# Patient Record
Sex: Female | Born: 1980 | Race: Black or African American | Hispanic: No | Marital: Single | Smoking: Current every day smoker
Health system: Southern US, Community
[De-identification: ages and names within clinical notes are randomized; demographics above are authoritative.]

## PROBLEM LIST (undated history)

## (undated) DIAGNOSIS — F419 Anxiety disorder, unspecified: Secondary | ICD-10-CM

## (undated) DIAGNOSIS — T7840XA Allergy, unspecified, initial encounter: Secondary | ICD-10-CM

## (undated) DIAGNOSIS — F32A Depression, unspecified: Secondary | ICD-10-CM

## (undated) DIAGNOSIS — F4310 Post-traumatic stress disorder, unspecified: Secondary | ICD-10-CM

## (undated) DIAGNOSIS — F329 Major depressive disorder, single episode, unspecified: Secondary | ICD-10-CM

## (undated) DIAGNOSIS — J40 Bronchitis, not specified as acute or chronic: Secondary | ICD-10-CM

## (undated) DIAGNOSIS — K589 Irritable bowel syndrome without diarrhea: Secondary | ICD-10-CM

## (undated) HISTORY — PX: APPENDECTOMY: SHX54

## (undated) HISTORY — DX: Anxiety disorder, unspecified: F41.9

## (undated) HISTORY — DX: Irritable bowel syndrome, unspecified: K58.9

## (undated) HISTORY — DX: Allergy, unspecified, initial encounter: T78.40XA

## (undated) HISTORY — PX: BREAST BIOPSY: SHX20

## (undated) HISTORY — DX: Bronchitis, not specified as acute or chronic: J40

---

## 2006-11-07 ENCOUNTER — Emergency Department: Payer: Self-pay | Admitting: Emergency Medicine

## 2007-01-28 ENCOUNTER — Emergency Department: Payer: Self-pay | Admitting: Emergency Medicine

## 2007-01-29 ENCOUNTER — Other Ambulatory Visit: Payer: Self-pay

## 2007-01-29 ENCOUNTER — Emergency Department: Payer: Self-pay | Admitting: Emergency Medicine

## 2007-10-08 ENCOUNTER — Emergency Department: Payer: Self-pay | Admitting: Unknown Physician Specialty

## 2008-05-08 IMAGING — US US PELV - US TRANSVAGINAL
1 series · 17 of 25 positions shown · non-contrast
Comparison: none

REASON FOR EXAM: Post D&C abortion,       pain bleeding
COMMENTS:

[Series 1: us pelv - us transvaginal · 17 of 39 slices shown]
[im 1/39]
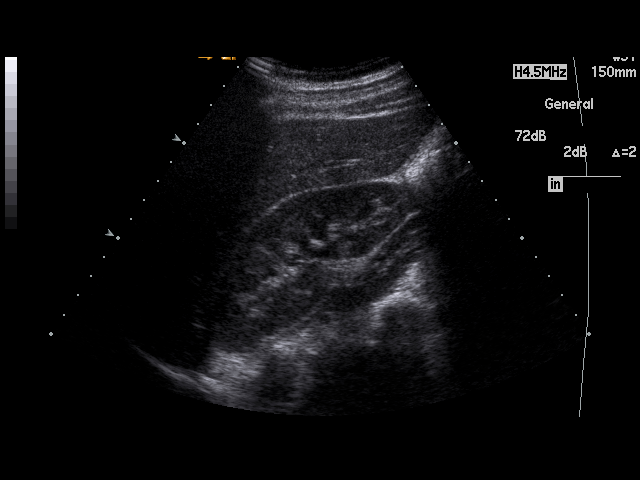
[im 4/39]
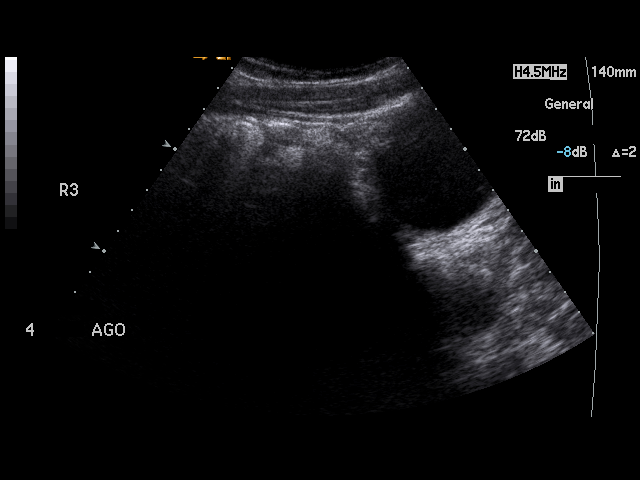
[im 5/39]
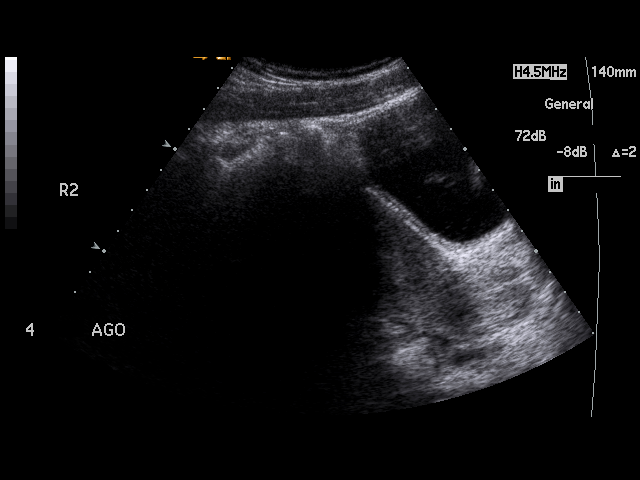
[im 8/39]
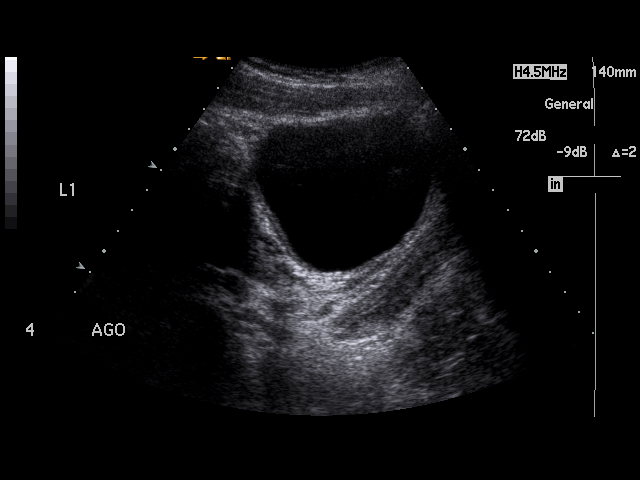
[im 10/39]
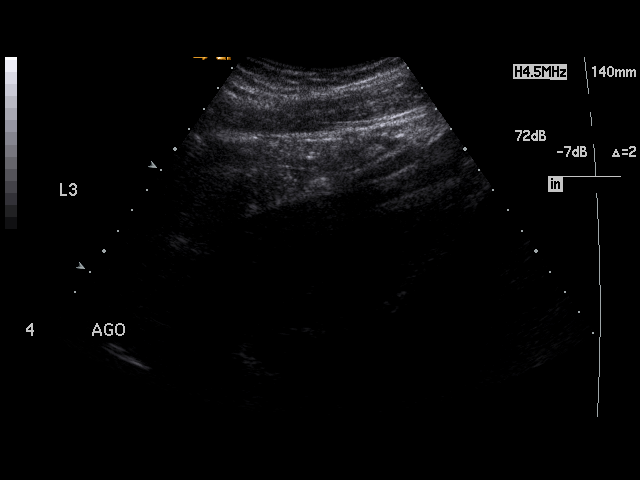
[im 13/39]
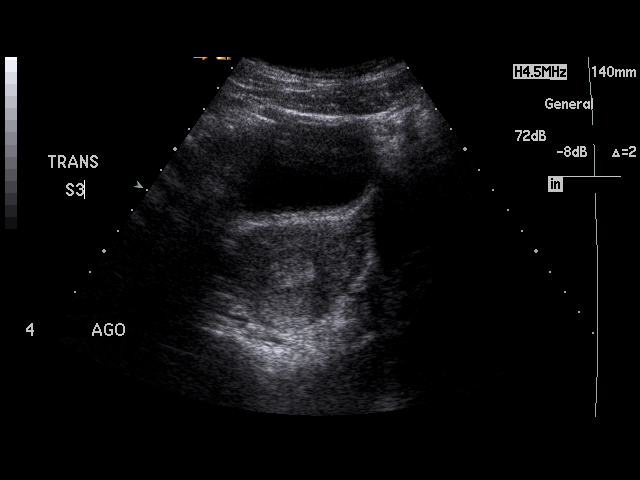
[im 15/39]
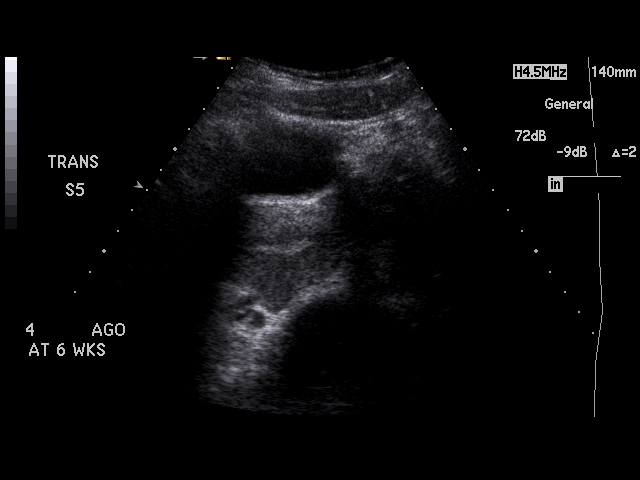
[im 18/39]
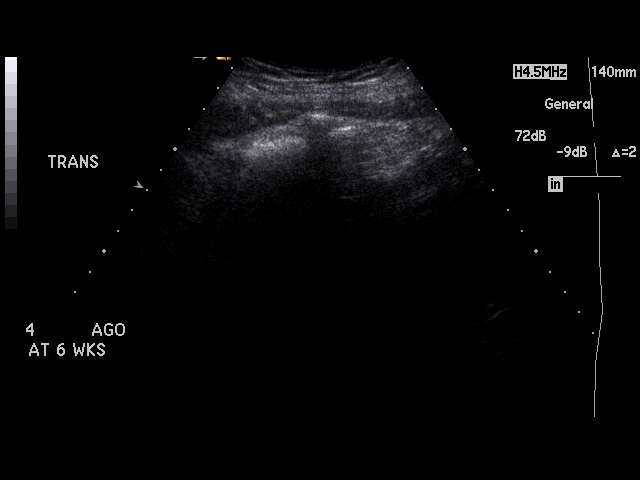
[im 20/39]
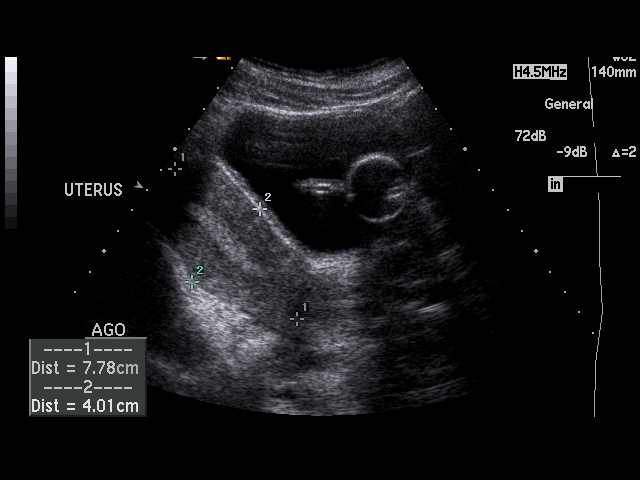
[im 21/39]
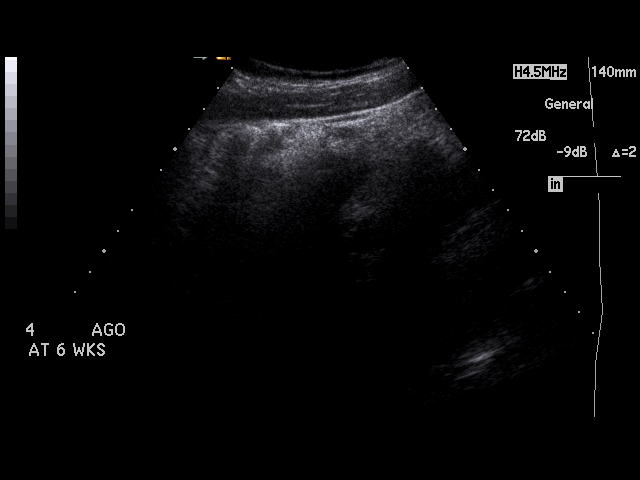
[im 24/39]
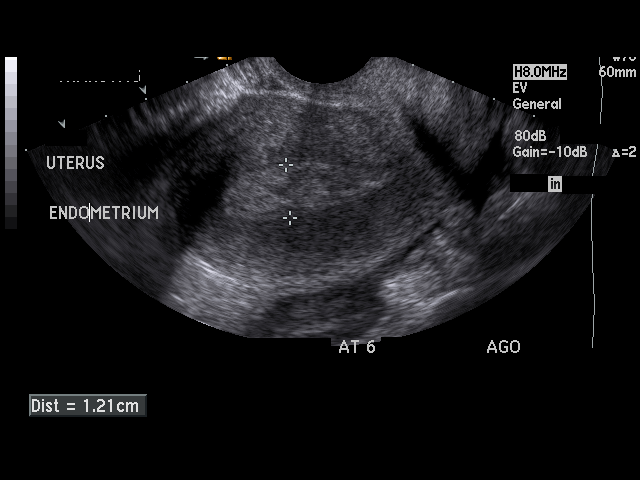
[im 26/39]
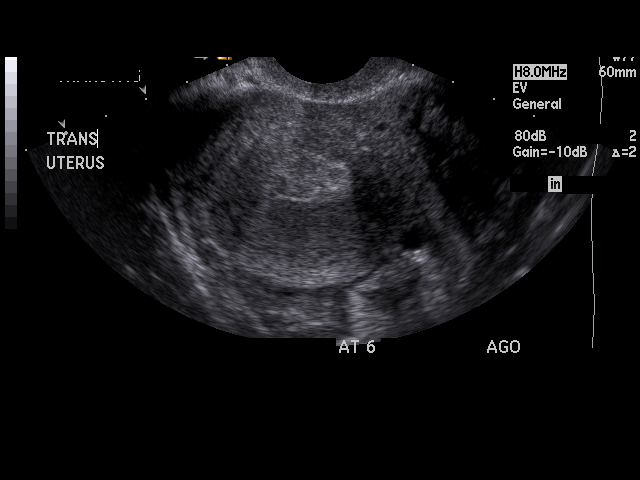
[im 29/39]
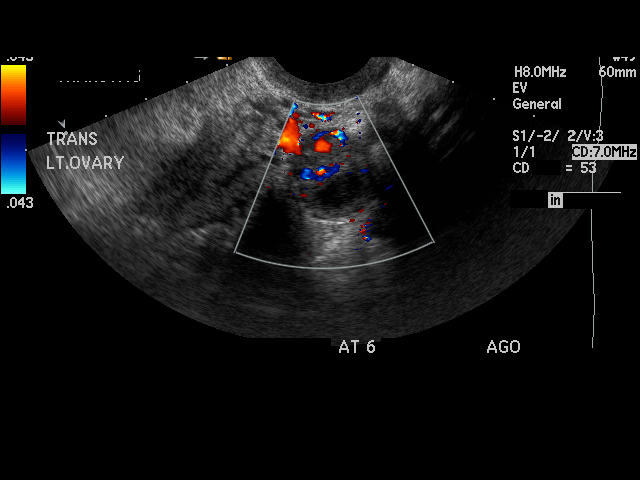
[im 31/39]
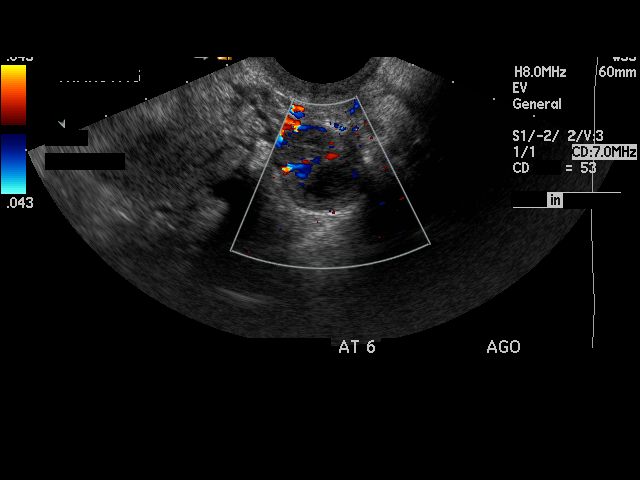
[im 34/39]
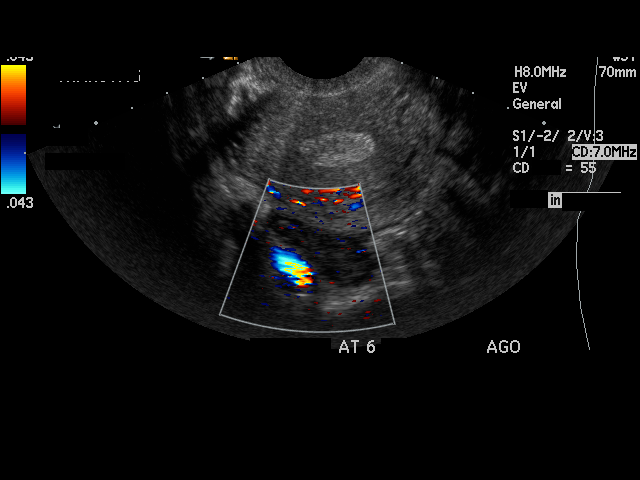
[im 35/39]
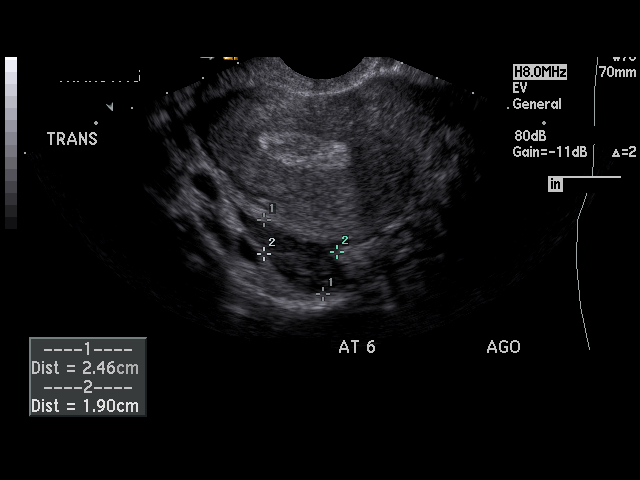
[im 39/39]
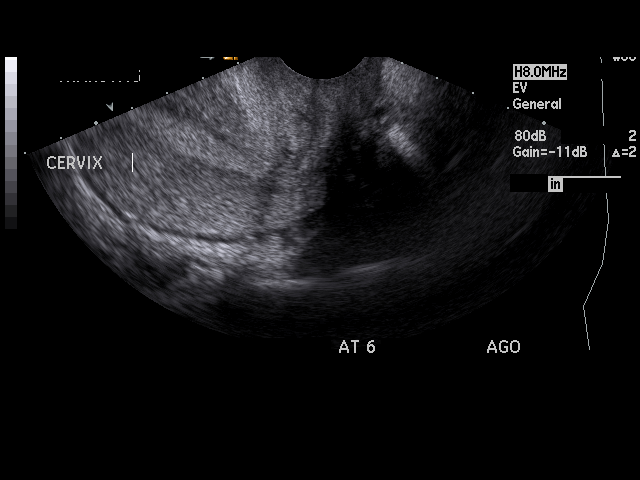

[17 of 25 positions shown; findings below may reference images not displayed]

PROCEDURE:     US  - US PELVIS MASS EXAM  - [DATE] [DATE] [DATE]  [DATE]

RESULT:     Transabdominal and endovaginal images were obtained of the
pelvis.  The uterus measures 7.78 x 4.39 x 5.8 cm.  Endometrial thickness is
1.21 cm.  There is increased echogenicity within the endometrial canal.
RIGHT and LEFT ovaries are unremarkable.  There is no evidence of free fluid
or drainable loculated fluid collections.  Limited evaluation of the kidneys
is unremarkable.
IMPRESSION: 1.     No ultrasound evidence of an intrauterine gestational sac. There is
increased echogenicity within the endometrial canal possibly representing an
element of heme. Clinical correlation is recommended.
2.     A preliminary faxed report was relayed to Dr. Moolman of the emergency
department on 11/07/06.

## 2009-09-12 ENCOUNTER — Emergency Department: Payer: Self-pay | Admitting: Emergency Medicine

## 2011-04-13 ENCOUNTER — Emergency Department: Payer: Self-pay | Admitting: Emergency Medicine

## 2011-07-03 ENCOUNTER — Emergency Department: Payer: Self-pay | Admitting: Emergency Medicine

## 2013-04-03 ENCOUNTER — Emergency Department: Payer: Self-pay | Admitting: Internal Medicine

## 2016-10-05 ENCOUNTER — Emergency Department
Admission: EM | Admit: 2016-10-05 | Discharge: 2016-10-05 | Discharge status: Against Medical Advice | Payer: Non-veteran care | Attending: Emergency Medicine | Admitting: Emergency Medicine

## 2016-10-05 ENCOUNTER — Encounter: Payer: Self-pay | Admitting: *Deleted

## 2016-10-05 ENCOUNTER — Emergency Department: Payer: Non-veteran care

## 2016-10-05 DIAGNOSIS — R102 Pelvic and perineal pain: Secondary | ICD-10-CM | POA: Diagnosis present

## 2016-10-05 DIAGNOSIS — X58XXXA Exposure to other specified factors, initial encounter: Secondary | ICD-10-CM

## 2016-10-05 DIAGNOSIS — F17210 Nicotine dependence, cigarettes, uncomplicated: Secondary | ICD-10-CM | POA: Diagnosis not present

## 2016-10-05 HISTORY — DX: Major depressive disorder, single episode, unspecified: F32.9

## 2016-10-05 HISTORY — DX: Post-traumatic stress disorder, unspecified: F43.10

## 2016-10-05 HISTORY — DX: Depression, unspecified: F32.A

## 2016-10-05 LAB — CBC WITH DIFFERENTIAL/PLATELET
BASOS ABS: 0 10*3/uL (ref 0–0.1)
Basophils Relative: 1 %
EOS PCT: 1 %
Eosinophils Absolute: 0 10*3/uL (ref 0–0.7)
HEMATOCRIT: 34.5 % — AB (ref 35.0–47.0)
Hemoglobin: 12 g/dL (ref 12.0–16.0)
LYMPHS ABS: 1.5 10*3/uL (ref 1.0–3.6)
LYMPHS PCT: 30 %
MCH: 32.9 pg (ref 26.0–34.0)
MCHC: 34.9 g/dL (ref 32.0–36.0)
MCV: 94.4 fL (ref 80.0–100.0)
MONO ABS: 0.4 10*3/uL (ref 0.2–0.9)
MONOS PCT: 9 %
NEUTROS ABS: 3 10*3/uL (ref 1.4–6.5)
Neutrophils Relative %: 59 %
PLATELETS: 277 10*3/uL (ref 150–440)
RBC: 3.65 MIL/uL — ABNORMAL LOW (ref 3.80–5.20)
RDW: 12.5 % (ref 11.5–14.5)
WBC: 5 10*3/uL (ref 3.6–11.0)

## 2016-10-05 LAB — COMPREHENSIVE METABOLIC PANEL
ALBUMIN: 4.1 g/dL (ref 3.5–5.0)
ALT: 14 U/L (ref 14–54)
ANION GAP: 7 (ref 5–15)
AST: 17 U/L (ref 15–41)
Alkaline Phosphatase: 50 U/L (ref 38–126)
BUN: 9 mg/dL (ref 6–20)
CHLORIDE: 104 mmol/L (ref 101–111)
CO2: 26 mmol/L (ref 22–32)
Calcium: 8.7 mg/dL — ABNORMAL LOW (ref 8.9–10.3)
Creatinine, Ser: 0.73 mg/dL (ref 0.44–1.00)
GFR calc Af Amer: >60 mL/min (ref >60)
GFR calc non Af Amer: >60 mL/min (ref >60)
Glucose, Bld: 105 mg/dL — ABNORMAL HIGH (ref 65–99)
POTASSIUM: 3.5 mmol/L (ref 3.5–5.1)
SODIUM: 137 mmol/L (ref 135–145)
TOTAL PROTEIN: 6.9 g/dL (ref 6.5–8.1)
Total Bilirubin: 0.6 mg/dL (ref 0.3–1.2)

## 2016-10-05 LAB — URINALYSIS, COMPLETE (UACMP) WITH MICROSCOPIC
BACTERIA UA: NONE SEEN
BILIRUBIN URINE: NEGATIVE
Glucose, UA: NEGATIVE mg/dL
Ketones, ur: NEGATIVE mg/dL
Nitrite: NEGATIVE
Protein, ur: NEGATIVE mg/dL
SPECIFIC GRAVITY, URINE: 1.016 (ref 1.005–1.030)
pH: 6 (ref 5.0–8.0)

## 2016-10-05 LAB — POCT PREGNANCY, URINE: Preg Test, Ur: NEGATIVE

## 2016-10-05 LAB — PROTIME-INR
INR: 1.11
Prothrombin Time: 14.4 seconds (ref 11.4–15.2)

## 2016-10-05 MED ORDER — SODIUM CHLORIDE 0.9 % IV BOLUS (SEPSIS)
1000.0000 mL | Freq: Once | INTRAVENOUS | Status: AC
  Administered 2016-10-05: 1000 mL via INTRAVENOUS

## 2016-10-05 MED ORDER — GABAPENTIN 300 MG PO CAPS
900.0000 mg | ORAL_CAPSULE | Freq: Once | ORAL | Status: AC
  Administered 2016-10-05: 900 mg via ORAL
  Filled 2016-10-05: qty 3

## 2016-10-05 MED ORDER — HALOPERIDOL LACTATE 5 MG/ML IJ SOLN
2.5000 mg | Freq: Once | INTRAMUSCULAR | Status: AC
  Administered 2016-10-05: 2.5 mg via INTRAVENOUS
  Filled 2016-10-05: qty 1

## 2016-10-05 NOTE — ED Notes (Signed)
Pelvic exam completed by Dr. Lamont Snowballifenbark. Assisted by this RN. Patient tolerated well.

## 2016-10-05 NOTE — ED Provider Notes (Signed)
[facility] Emergency Department Provider Note  ____________________________________________   First MD Initiated Contact with Patient 10/05/16 1604     (approximate)  I have reviewed the triage vital signs and the nursing notes.   HISTORY  Chief Complaint Pelvic Pain    HPI Melissa Bond is a 36 y.o. female who comes to the emergency department with severe aching vaginal and lower pelvic pain. She is G3P1Tab2. She reports intermittent pain for the past 2 years and has been followed extensively at the [facility]. She said today her pain was worse while at work and she could not drive herself all the way to the [place] which is what prompted the visit to our emergency department. She says she's been seen multiple times at the [place] including a visit to see an OB gynecologist but has been never given a clear diagnosis. She says she's had a pelvic ultrasound and also a pelvic MRI and was told "I had an infection in my urethra". The pain happens 3-4 days a week past several years she takes ibuprofen throughout the day to help with the pain. The pain often wakes her from her sleep. Her last menstrual period was 2 weeks ago and she does not note any temporal relation of the pain and her period. She does report this.. Denies dysuria frequency or hesitancy. Denies fevers or chills. Denies back pain. She is currently taking an unknown   Past Medical History:  Diagnosis Date  . Depression   . PTSD (post-traumatic stress disorder)     There are no active problems to display for this patient.   History reviewed. No pertinent surgical history.  Prior to Admission medications   Not on File    Allergies Doxapap-n [propoxyphene] and Doxycycline  No family history on file.  Social History Social History  Substance Use Topics  . Smoking status: Current Every Day Smoker    Packs/day: 1.00    Types: Cigarettes  . Smokeless tobacco: Not on file  . Alcohol  use Yes    Review of Systems Constitutional: No fever/chills Eyes: No visual changes. ENT: No sore throat. Cardiovascular: Denies chest pain. Respiratory: Denies shortness of breath. Gastrointestinal: No abdominal pain.  No nausea, no vomiting.  No diarrhea.  No constipation. Genitourinary: Positive for pelvic pain Musculoskeletal: Negative for back pain. Skin: Negative for rash. Neurological: Negative for headaches, focal weakness or numbness.  10-point ROS otherwise negative.  ____________________________________________   PHYSICAL EXAM:  VITAL SIGNS: ED Triage Vitals  Enc Vitals Group     BP 10/05/16 1537 (!) 144/84     Pulse Rate 10/05/16 1537 (!) 12     Resp 10/05/16 1537 20     Temp 10/05/16 1537 98.8 F (37.1 C)     Temp Source 10/05/16 1537 Oral     SpO2 10/05/16 1537 99 %     Weight 10/05/16 1538 204 lb (92.5 kg)     Height 10/05/16 1538 5\' 5"  (1.651 m)     Head Circumference --      Peak Flow --      Pain Score 10/05/16 1538 6     Pain Loc --      Pain Edu? --      Excl. in GC? --     Constitutional: Alert and oriented. Well appearing and in no acute distress. Eyes: Conjunctivae are normal. PERRL. EOMI. Head: Atraumatic. Nose: No congestion/rhinnorhea. Mouth/Throat: Mucous membranes are moist.  Oropharynx non-erythematous. Neck: No stridor.   Cardiovascular:  Normal rate, regular rhythm. Grossly normal heart sounds.  Good peripheral circulation. Respiratory: Normal respiratory effort.  No retractions. Lungs CTAB. Gastrointestinal: Soft and nontender. No distention. No abdominal bruits. No CVA tenderness.  Pelvic exam chaperoned by female nurse,: Normal external exam os closed no dischagre, no lacerations, no CMT, no adnexal tenderness, no Bartholin's swelling Musculoskeletal: No lower extremity tenderness nor edema.  No joint effusions. Neurologic:  Normal speech and language. No gross focal neurologic deficits are appreciated. No gait instability. Skin:   Skin is warm, dry and intact. No rash noted. Psychiatric: Mood and affect are normal. Speech and behavior are normal.  ____________________________________________   LABS (all labs ordered are listed, but only abnormal results are displayed)  Labs Reviewed  URINALYSIS, COMPLETE (UACMP) WITH MICROSCOPIC - Abnormal; Notable for the following:       Result Value   Color, Urine YELLOW (*)    APPearance CLEAR (*)    Hgb urine dipstick SMALL (*)    Leukocytes, UA TRACE (*)    Squamous Epithelial / LPF 0-5 (*)    All other components within normal limits  COMPREHENSIVE METABOLIC PANEL - Abnormal; Notable for the following:    Glucose, Bld 105 (*)    Calcium 8.7 (*)    All other components within normal limits  CBC WITH DIFFERENTIAL/PLATELET - Abnormal; Notable for the following:    RBC 3.65 (*)    HCT 34.5 (*)    All other components within normal limits  PROTIME-INR  POC URINE PREG, ED  POCT PREGNANCY, URINE   ____________________________________________  EKG   ____________________________________________  RADIOLOGY  Pelvic ultrasound with no acute disease ____________________________________________   PROCEDURES  Procedure(s) performed: no  Procedures  Critical Care performed: no  ____________________________________________   INITIAL IMPRESSION / ASSESSMENT AND PLAN / ED COURSE  Pertinent labs & imaging results that were available during my care of the patient were reviewed by me and considered in my medical decision making (see chart for details).     ----------------------------------------- 6:38 PM on 10/05/2016 -----------------------------------------  I was on my way into discharge the patient when I was notified by nursing staff that the patient had eloped. I would have discharged her anyway and she leaves in good stable condition with no emergent conditions notified. IV was removed prior to  leaving.  ____________________________________________   FINAL CLINICAL IMPRESSION(S) / ED DIAGNOSES  Final diagnoses:  Unknown cause of injury  Pelvic pain in female      NEW MEDICATIONS STARTED DURING THIS VISIT:  There are no discharge medications for this patient.    Note:  This document was prepared using Dragon voice recognition software and may include unintentional dictation errors.     Merrily BrittleNeil Sander Remedios, MD 10/08/16 2029

## 2016-10-05 NOTE — ED Triage Notes (Signed)
Pt saw PCP yesterday for chronic pelvic pain, appointment with GYN Monday, pt complains of lower pelvic pain, pt denies vaginal bleeding

## 2016-10-05 NOTE — ED Notes (Signed)
This RN received a call from [place], First nurse notifying me that the patient is attempting to walk out of the main lobby doors with IV in place. Kennedy Buckerhanh, triage tech removed IV. Patient did not receive discharge instructions or results from her US. Unable to obtain AMA signature and discharge vital signs.

## 2016-10-05 NOTE — ED Notes (Signed)
Patient transported to US 

## 2016-10-05 NOTE — Discharge Instructions (Signed)
[facility] as scheduled return to the emergency department sooner for any new or worsening symptoms.

## 2017-02-20 ENCOUNTER — Ambulatory Visit: Payer: Non-veteran care | Admitting: Physical Therapy

## 2017-02-28 ENCOUNTER — Ambulatory Visit: Payer: Non-veteran care | Attending: Obstetrics and Gynecology | Admitting: Physical Therapy

## 2017-02-28 ENCOUNTER — Encounter: Payer: Self-pay | Admitting: Physical Therapy

## 2017-02-28 DIAGNOSIS — R278 Other lack of coordination: Secondary | ICD-10-CM | POA: Diagnosis present

## 2017-02-28 DIAGNOSIS — M791 Myalgia, unspecified site: Secondary | ICD-10-CM

## 2017-02-28 DIAGNOSIS — R29898 Other symptoms and signs involving the musculoskeletal system: Secondary | ICD-10-CM | POA: Diagnosis present

## 2017-02-28 NOTE — Therapy (Signed)
[facility] MAIN [facility] SERVICES [address] [place], [place], [address] Phone: [phone]   Fax:  [phone]  Physical Therapy Evaluation  Patient Details  Name: Melissa Bond MRN: [id number] Date of Birth: 05-16-1981 Referring Provider: Lezlie Lye   Encounter Date: 02/28/2017      PT End of Session - 02/28/17 1441    Visit Number 1   Number of Visits 12   Date for PT Re-Evaluation 06/13/2017   Authorization Type g codes    PT Start Time 1338   PT Stop Time 1440   PT Time Calculation (min) 62 min   Activity Tolerance Patient tolerated treatment well;No increased pain   Behavior During Therapy WFL for tasks assessed/performed      Past Medical History:  Diagnosis Date  . Allergy   . Anxiety   . Bronchitis   . Depression   . IBS (irritable bowel syndrome)   . PTSD (post-traumatic stress disorder)     Past Surgical History:  Procedure Laterality Date  . APPENDECTOMY    . BREAST BIOPSY Left    benign  . CESAREAN SECTION      There were no vitals filed for this visit.       Subjective Assessment - 02/28/17 1349    Subjective 1) Pt reports having pelvic pain located on the vaginal walls, cervix area, and radiating pain to anus.  This pain starting in 2003 and it has been progressively worse. Sometimes it is a dull ache 4/10 which makes it uncomfortable to sit and wakes her up during sleep. Other times, pain worsens 10/10 as intense throbbing pain. Pain is described as if something about to fall out.  In Feb 2018, pt visited ER for this pain. Pt would have to take Naproxen 4-5 x /day to manage the pain and her MD has advised her to not that many.  Pain interferes with her relationship with her boyfriend and children, sitting, standing for > 10 min, getting in and out of car,   and laying in certain positions on her side.  Warm baths, balms, ice, heating pads have not been helpful.  2) urge incontinence  3) IBS - constipated  and diarrhea for 10 years,  1-2x/ week bowel movements, no more than 3 bowel movements in one week. Type 1-2 for 2 out 3 bowel movements/ week, Type 6-7 occur 1 out 3 times  per week.       Pertinent History anxiety, depression, PTSD.  Denied falling onto tailbone. Gynecologist: 3 pregnancies, 1 live birth 2010 with C-section, appendectomy 1998.   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     Patient Stated Goals Pt reports she would like to have her life back and pain management             [facility] PT Assessment - 02/28/17 1503      Assessment   Medical Diagnosis Pelvic pain    Referring Provider Harley Alto Dean      Precautions   Precautions None     Restrictions   Weight Bearing Restrictions No     Balance Screen   Has the patient fallen in the past 6 months No     Prior Function   Level of Independence Independent     Observation/Other Assessments   Observations increased lumbar lordosis      Coordination   Gross Motor Movements are Fluid and Coordinated --   diaphragmatic excursion noted.abdominal straining w/  BM cue   Fine Motor Movements are Fluid and Coordinated --  limited pelvic floor ROM with cue for contraction      Posture/Postural Control   Posture Comments lumbopelvic perturbation with leg movemetns in hookyling      AROM   Overall AROM Comments ~20% B rotation       Palpation   Spinal mobility thoracic hypomobility    SI assessment  hypomobility of R SIJ    Palpation comment  reproduction of anterior pelvic pain with palpation to R coccgyeus ( increased tensions > L ) .              Objective measurements completed on examination: See above findings.        Pelvic Floor Special Questions - 02/28/17 1436    Diastasis Recti neg but abdominal bulging above umbilicus where appendectomy surgery occurred           [facility] Adult PT Treatment/Exercise - 02/28/17 1428      Exercises   Exercises --  pt instructions       Manual Therapy   Manual therapy comments R LE long axis,                 PT Education - 02/28/17 1434    Education provided Yes   Education Details POC,anatomy, physiology, goals, HEP   Person(s) Educated Patient   Methods Explanation;Demonstration;Tactile cues;Verbal cues;Handout   Comprehension Returned demonstration;Verbalized understanding             PT Long Term Goals - 02/28/17 1454      PT LONG TERM GOAL #1   Title Pt will demo proper deep core coordination and pelvic floor ROM in order to minimize strain onto the pelvic floor and pain   Time 6   Period Weeks   Status New     PT LONG TERM GOAL #2   Title Pt will demo no mm tensions at R coccygeus and not reproduction of anterior pelvic pain w/ palpation in prone in order to return ADLs.   Time 12   Period Weeks   Status New     PT LONG TERM GOAL #3   Title Pt will report improved regularity of bowel movements from 1-3x/ week to every other day or daily across 1 week in order to promote bowel function and GI health    Time 12   Period Weeks   Status New     PT LONG TERM GOAL #4   Title Pt will demo no upper abdominal bulging above umbilicus with head lift in order to demo improve intraabdominal pressure system and strong deep core in order to promote motility and less load onto the pelvic floor   Time 12   Period Weeks   Status New     PT LONG TERM GOAL #5   Title Pt will decrease her score on PDI from 34% to < 29% in order to improve QOL   Time 12   Period Weeks   Status New     Additional Long Term Goals   Additional Long Term Goals Yes     PT LONG TERM GOAL #6   Title Pt will decreased her COREFO score from 28% to < 23% in order to improve bowel function    Time 12   Period Weeks   Status New                Plan - 02/28/17 1442    Clinical Impression Statement  Pt is a 36 yo female who reports chronic pelvic pain along with pelvic floor dysfunction ( urge incontience and  diarrhea/constipation associated with IBS). These deficits impact her relationships with family, sitting, standing. Her clinical presentations include increased pelvic floor mm tensions (R coccgyeus reproducing anterior pelvic pain), dyscoordination of deep core mm, limited spinal/pelvic floor ROM,  abdominal bulging above umbilicus,  and poor body mechanics which place strain onto the abdominal, pelvic floor mm.  Following Tx today, pt demo'd proper body mechanics with sitting, log rollign out of bed instead of crunching up, and toieting posture w/ breathing to minimize abdominal straining.     History and Personal Factors relevant to plan of care: anxiety, depression, PTSD.  Denied falling onto tailbone. Gynecologist: 3 pregnancies, 1 live birth 2010 with C-section, appendectomy 1998.   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     Clinical Presentation Evolving   Clinical Decision Making Moderate   Rehab Potential Good   PT Frequency 1x / week   PT Duration 12 weeks   PT Treatment/Interventions ADLs/Self Care Home Management;Aquatic Therapy;Neuromuscular re-education;Patient/family education;Cryotherapy;Electrical Stimulation;Functional mobility training;Therapeutic activities;Therapeutic exercise;Balance training;Gait training;Scar mobilization;Taping      Patient will benefit from skilled therapeutic intervention in order to improve the following deficits and impairments:  Obesity, Pain, Decreased strength, Decreased mobility, Increased muscle spasms, Decreased range of motion, Postural dysfunction, Hypermobility, Decreased safety awareness, Decreased coordination, Improper body mechanics, Decreased scar mobility, Decreased endurance, Hypomobility, Impaired sensation, Impaired flexibility  Visit Diagnosis: Myalgia  Other lack of coordination  Other symptoms and signs involving the musculoskeletal system      G-Codes - 03/08/2017 1500    Functional Assessment Tool Used  (Outpatient Only) CORECO 28%, PDI 34%  , clinical judgement   Functional Limitation Self care   Self Care Current Status (O9629) At least 20 percent but less than 40 percent impaired, limited or restricted   Self Care Goal Status (B2841) At least 1 percent but less than 20 percent impaired, limited or restricted       Problem List There are no active problems to display for this patient.   Mariane Masters ,PT, DPT, E-RYT  03/08/2017, 3:05 PM  [facility] MAIN [facility] SERVICES [address] [place], [place], [address] Phone: [phone]   Fax:  [phone]  Name: Melissa Bond MRN: [id number] Date of Birth: 23-Jun-1981

## 2017-02-28 NOTE — Patient Instructions (Addendum)
    Proper sitting posture: Feet on floor  Knees above ankles, Rock pelvic forward, back and then find pelvic neutral on ischial tuberosity       Stretches for pelvic floor: Seated Cross __  ankle over __  Thigh  Inhale, feel pelvic floor lengthen/diaphragm expand left and right laterally Exhale, bring thighs closer to chest by pulling towel with hands. Keep shoulders and neck down on the pillow and relaxed.   3 breaths      Avoid straining pelvic floor, abdominal muscles , spine  Use log rolling technique instead of getting out of bed with your neck or the sit-up   Log rolling out of .bed  L  arm overhead  Raise hips and scoot hips to R   Drop knees to L,  scooting L shoulder back to get completely on your L side so your shoulders, hips, and knees point to the L    Then breathe as you drop feet off bed and prop onto L elbow and  use both hands to push yourself

## 2017-03-06 ENCOUNTER — Ambulatory Visit: Payer: Non-veteran care | Admitting: Physical Therapy

## 2017-03-15 ENCOUNTER — Ambulatory Visit: Payer: Non-veteran care | Admitting: Physical Therapy

## 2017-03-29 ENCOUNTER — Encounter: Payer: Non-veteran care | Admitting: Physical Therapy

## 2017-04-05 ENCOUNTER — Encounter: Payer: Non-veteran care | Admitting: Physical Therapy

## 2017-04-12 ENCOUNTER — Encounter: Payer: Non-veteran care | Admitting: Physical Therapy

## 2017-04-24 ENCOUNTER — Encounter: Payer: Non-veteran care | Admitting: Physical Therapy

## 2017-05-08 ENCOUNTER — Encounter: Payer: Non-veteran care | Admitting: Physical Therapy

## 2018-08-02 ENCOUNTER — Ambulatory Visit (HOSPITAL_COMMUNITY): Payer: Self-pay | Admitting: Psychiatry

## 2018-12-13 ENCOUNTER — Encounter: Payer: Self-pay | Admitting: Psychiatry

## 2018-12-13 ENCOUNTER — Other Ambulatory Visit: Payer: Self-pay

## 2018-12-13 ENCOUNTER — Ambulatory Visit (INDEPENDENT_AMBULATORY_CARE_PROVIDER_SITE_OTHER): Payer: Non-veteran care | Admitting: Psychiatry

## 2018-12-13 ENCOUNTER — Telehealth: Payer: Self-pay

## 2018-12-13 DIAGNOSIS — F4310 Post-traumatic stress disorder, unspecified: Secondary | ICD-10-CM

## 2018-12-13 DIAGNOSIS — F5105 Insomnia due to other mental disorder: Secondary | ICD-10-CM

## 2018-12-13 DIAGNOSIS — F410 Panic disorder [episodic paroxysmal anxiety] without agoraphobia: Secondary | ICD-10-CM | POA: Diagnosis not present

## 2018-12-13 MED ORDER — GUANFACINE HCL ER 2 MG PO TB24: 2.0000 mg | ORAL_TABLET | Freq: Every day | ORAL | 1 refills | Status: DC

## 2018-12-13 MED ORDER — PAROXETINE HCL 30 MG PO TABS: 30.0000 mg | ORAL_TABLET | Freq: Every day | ORAL | 1 refills | Status: DC

## 2018-12-13 MED ORDER — BUSPIRONE HCL 10 MG PO TABS: 10.0000 mg | ORAL_TABLET | Freq: Two times a day (BID) | ORAL | 1 refills | Status: DC

## 2018-12-13 MED ORDER — TRAZODONE HCL 50 MG PO TABS: 50.0000 mg | ORAL_TABLET | Freq: Every evening | ORAL | 1 refills | Status: DC | PRN
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
## 2018-12-13 NOTE — Progress Notes (Signed)
Tc on  12-13-18 @ 1:48 pt medical and surgical hx reviewed and updated.  Medication and pharmacy was reviewed and updated. Pt allergies were reviewed with no changes. No vital taken due to this was a phone visit.

## 2018-12-13 NOTE — Telephone Encounter (Signed)
faxed and confirmed 2 rx paxil 30mg  id # [id number] order # [id number]  and intuniv 2mg  id # [id number] order # [id number]

## 2018-12-14 ENCOUNTER — Telehealth: Payer: Self-pay

## 2018-12-14 DIAGNOSIS — F4310 Post-traumatic stress disorder, unspecified: Secondary | ICD-10-CM

## 2018-12-14 MED ORDER — FLUOXETINE HCL 40 MG PO CAPS: 40.0000 mg | ORAL_CAPSULE | Freq: Every day | ORAL | 1 refills | Status: DC

## 2018-12-14 NOTE — Telephone Encounter (Signed)
Spoke to patient. Discussed paxil and Intuniv not on formulary. She is ok with prozac.

## 2018-12-14 NOTE — Telephone Encounter (Signed)
[facility] pharmacy called regarding patient's prescriptions. They stated that her Paroxetine 30mg  is not formulary. They stated the preferred medications were Citalopram or Fluoxetine. Also they stated her Quanfacine 2 mg is not formulary. They stated the preferred medications for Guanfacine are Bupropion, Amitriptyline, or Nortriptyline. Please review and advise. Thank you.

## 2018-12-21 ENCOUNTER — Telehealth: Payer: Self-pay

## 2018-12-21 NOTE — Telephone Encounter (Signed)
Patient's pharmacy called requesting clarification on her guanfacine 2mg  which they stated is not formulary. They are questioning if the Fluoxetine 40mg  is prescribed in place of the guanfacine? (They again stated Clonidine, Nortriptyline, Amitriptyline, and Bupropion as options). Please review and advise. Thank you.

## 2018-12-21 NOTE — Telephone Encounter (Signed)
pls let them know Fluoxetine is replacing Paxil. Please disregard Guanfacine. pls let them know.thanks

## 2019-01-08 ENCOUNTER — Telehealth: Payer: Self-pay

## 2019-01-08 NOTE — Telephone Encounter (Signed)
Returned call to patient , she reports she was not feeling well , had a lot of bleeding during the time of May 18 to may 22. She spoke to her PMD who advised her to wait and watch and discussed possible miscarriage , but she did not get any kind of testing during this time . She is wondering if writer can give her a note to take leave of absence during this time when she did not work since she was in a difficult state of mind. Discussed with patient that she also needs to talk to her PMD who treated her during this time since I have not had any contact with her during that time period.However discussed that we can give a letter stating she was going through difficult emotional state and could not go to work at that time. However , she will follow up with PMD for further treatment and management of her bleeding . Discussed to keep her appointment with writer on 01/11/2019.

## 2019-01-08 NOTE — Telephone Encounter (Signed)
pt called states that with the medication changes she is not doing well at all.  she states especially over the weekend she was not right and she also believes that the medication caused her to have a miscarriage.  she wanted to speak with you and then she wanted to speak with you about possibly witting a note for her so she doesn't lose her job.

## 2019-01-09 NOTE — Telephone Encounter (Signed)
Done yesterday.

## 2019-01-11 ENCOUNTER — Ambulatory Visit (INDEPENDENT_AMBULATORY_CARE_PROVIDER_SITE_OTHER): Payer: Non-veteran care | Admitting: Psychiatry

## 2019-01-11 ENCOUNTER — Encounter: Payer: Self-pay | Admitting: Psychiatry

## 2019-01-11 ENCOUNTER — Other Ambulatory Visit: Payer: Self-pay

## 2019-01-11 DIAGNOSIS — F4310 Post-traumatic stress disorder, unspecified: Secondary | ICD-10-CM | POA: Diagnosis not present

## 2019-01-11 DIAGNOSIS — F410 Panic disorder [episodic paroxysmal anxiety] without agoraphobia: Secondary | ICD-10-CM

## 2019-01-11 DIAGNOSIS — Z9114 Patient's other noncompliance with medication regimen: Secondary | ICD-10-CM

## 2019-01-11 DIAGNOSIS — F5105 Insomnia due to other mental disorder: Secondary | ICD-10-CM

## 2019-01-11 MED ORDER — TRAZODONE HCL 50 MG PO TABS: 75.0000 mg | ORAL_TABLET | Freq: Every day | ORAL | 1 refills | Status: DC

## 2019-01-11 NOTE — Progress Notes (Signed)
Virtual Visit via Video Note  I connected with Melissa Bond on 01/11/19 at  9:30 AM EDT by a video enabled telemedicine application and verified that I am speaking with the correct person using two identifiers.   I discussed the limitations of evaluation and management by telemedicine and the availability of in person appointments. The patient expressed understanding and agreed to proceed.    I discussed the assessment and treatment plan with the patient. The patient was provided an opportunity to ask questions and all were answered. The patient agreed with the plan and demonstrated an understanding of the instructions.   The patient was advised to call back or seek an in-person evaluation if the symptoms worsen or if the condition fails to improve as anticipated.   BH MD OP Progress Note  01/11/2019 12:46 PM Melissa BaldingYolanda Bond  MRN:  [id number]  Chief Complaint:  Chief Complaint    Follow-up     HPI: Melissa LagerYolanda is a 38 year old African-American female, single, employed, has a history of PTSD, panic attacks, insomnia, [place] in [place] was evaluated by telemedicine today.  Patient today appeared to be sad.  Patient reports that she has been having some sadness, low energy as well as feels as though she is all over the place.  She was not able to elaborate her symptoms more than this.  Patient reports that she she does not know if it is her mood symptoms or her PTSD diagnosis which is contributing to how she feels versus if it is her hormonal changes.  She reports that she recently had a lot of bleeding and was told she may have had a miscarriage.  Patient had called the clinic also last week stating she had a lot of bleeding from her period and was told that she may have had a miscarriage.  She however reports she never planned for a pregnancy and does not want to be pregnant also at this time.  She was asked at that time to go to her primary medical doctor for further follow-up.  She also had  requested a letter from us to take some time off reporting she was emotionally not doing well during the time that she had it.  Patient today however reports she never went back for a follow-up visit.  She reported that she called the office and left a message and is waiting for a call back.  Patient also has not started psychotherapy sessions with her therapist Margorie Johnerry Vaughn -she today reports she may have seen her therapist a year ago and has not gotten back with her yet.  When patient was asked about what other medication she is taking at this time she was not very sure about her medications.  While in session patient was advised to check her prescription bottles.  Patient had a difficult time getting her medications out.  She read out a few names of medications like BuSpar and Paxil.  When it was discussed with patient that she is no longer on Paxil , she reported that she is taking fluoxetine.  Advised patient not to combine Paxil and fluoxetine together.  When asked about compliance with medication she reported she may have been skipping her dosages here and there.  However it looks like she is not compliant with her medications at all at this point.  She could not even give the names of the medications that she was taking.  Encouraged compliance, provided medication education.  Also discussed with patient to reach out to her  therapist to start psychotherapy sessions again.  Discussed with patient if she is having trouble with that to let writer know and she can be referred to therapist here in clinic.  Patient reports sleep continues to be restless.  When asked whether she is taking her trazodone regularly she reported that she may have taken it at least twice since her last visit with  Clinical research associate.  Patient reported that when she took the medications sleep may have been a little bit better.  Discussed with patient her trazodone can be readjusted.  Advised her to take it regularly so that she can get better  sleep.  She denies suicidality, homicidality or perceptual disturbances.  Visit Diagnosis:    ICD-10-CM   1. PTSD (post-traumatic stress disorder) F43.10   2. 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 Noncompliance with medication regimen Z91.14     Past Psychiatric History: Reviewed past psychiatric history from my progress note on 12/13/2018.  Past trials of Prozac, trazodone, Paxil, Intuniv, BuSpar.  Past Medical History:  Past Medical History:  Diagnosis Date  . Allergy   . Anxiety   . Bronchitis   . Depression   . IBS (irritable bowel syndrome)   . PTSD (post-traumatic stress disorder)     Past Surgical History:  Procedure Laterality Date  . APPENDECTOMY    . BREAST BIOPSY Left    benign  . CESAREAN SECTION      Family Psychiatric History: Reviewed family psychiatric history from my progress note on 12/13/2018.  Family History:  Family History  Problem Relation Age of Onset  . Alcohol abuse Mother   . Anxiety disorder Mother   . Depression Mother   . Alcohol abuse Father   . Alcohol abuse Maternal Grandfather   . Alcohol abuse Paternal Grandfather     Social History: Reviewed social history from my progress note on 12/13/2018. Social History   Socioeconomic History  . Marital status: Single    Spouse name: Not on file  . Number of children: 1  . Years of education: Not on file  . Highest education level: Some college, no degree  Occupational History  . Not on file  Social Needs  . Financial resource strain: Not hard at all  . Food insecurity:    Worry: Never true    Inability: Never true  . Transportation needs:    Medical: No    Non-medical: No  Tobacco Use  . Smoking status: Current Every Day Smoker    Packs/day: 0.00    Years: 15.00    Pack years: 0.00    Types: Cigars  . Smokeless tobacco: Never Used  Substance and Sexual Activity  . Alcohol use: Yes    Alcohol/week: 20.0 - 25.0  standard drinks    Types: 14 Glasses of wine, 1 Cans of beer, 5 - 10 Shots of liquor per week  . Drug use: No  . Sexual activity: Yes    Birth control/protection: Pill  Lifestyle  . Physical activity:    Days per week: 0 days    Minutes per session: 0 min  . Stress: Very much  Relationships  . Social connections:    Talks on phone: Not on file    Gets together: Not on file    Attends religious service: More than 4 times per year    Active member of club or organization: Yes    Attends meetings of clubs or  organizations: More than 4 times per year    Relationship status: Separated  Other Topics Concern  . Not on file  Social History Narrative  . Not on file    Allergies:  Allergies  Allergen Reactions  . Doxapap-N [Propoxyphene]   . Doxycycline Rash    Metabolic Disorder Labs: No results found for: HGBA1C, MPG No results found for: PROLACTIN No results found for: CHOL, TRIG, HDL, CHOLHDL, VLDL, LDLCALC No results found for: TSH  Therapeutic Level Labs: No results found for: LITHIUM No results found for: VALPROATE No components found for:  CBMZ  Current Medications: Current Outpatient Medications  Medication Sig Dispense Refill  . busPIRone (BUSPAR) 10 MG tablet Take 1 tablet (10 mg total) by mouth 2 (two) times daily. 60 tablet 1  . FLUoxetine (PROZAC) 40 MG capsule Take 1 capsule (40 mg total) by mouth daily. 30 capsule 1  . Multiple Vitamin (MULTIVITAMIN) capsule Take 1 capsule by mouth daily.    . traZODone (DESYREL) 50 MG tablet Take 1.5 tablets (75 mg total) by mouth at bedtime. 45 tablet 1  . Vitamin D, Ergocalciferol, (DRISDOL) 50000 units CAPS capsule Take 50,000 Units by mouth every 7 (seven) days.     No current facility-administered medications for this visit.      Musculoskeletal: Strength & Muscle Tone: within normal limits Gait & Station: normal Patient leans: N/A  Psychiatric Specialty Exam: Review of Systems  Psychiatric/Behavioral: The  patient is nervous/anxious and has insomnia.   All other systems reviewed and are negative.   There were no vitals taken for this visit.There is no height or weight on file to calculate BMI.  General Appearance: Casual  Eye Contact:  Fair  Speech:  Clear and Coherent  Volume:  Normal  Mood:  Anxious  Affect:  Congruent  Thought Process:  Goal Directed and Descriptions of Associations: Intact  Orientation:  Full (Time, Place, and Person)  Thought Content: Logical   Suicidal Thoughts:  No  Homicidal Thoughts:  No  Memory:  Immediate;   Fair Recent;   Fair Remote;   Fair  Judgement:  Fair  Insight:  Fair  Psychomotor Activity:  Normal  Concentration:  Concentration: Fair and Attention Span: Fair  Recall:  [employer] of Knowledge: Fair  Language: Fair  Akathisia:  No  Handed:  Right  AIMS (if indicated): Denies tremors, rigidity, stiffness  Assets:  Communication Skills Desire for Improvement Housing Social Support  ADL's:  Intact  Cognition: WNL  Sleep:  Poor   Screenings:   Assessment and Plan: Melissa Bond is a 38 year old African-American female, single, employed, lives in [place], has a history of PTSD was evaluated by telemedicine today.  Patient is biologically predisposed given her history of trauma, family history of mental health problems.  Patient with psychosocial stressors of being a single mother.  Patient continues to struggle with mood symptoms as well as sleep problems.  Patient also has been noncompliant with medications as well as psychotherapy sessions.  Plan as noted below.  Plan PTSD- unstable Continue Prozac 40 mg p.o. daily BuSpar 10 mg p.o. twice daily Discussed with patient to call her therapist Ms. Margorie [facility] to schedule an appointment as soon as possible.  Discussed with her to call the clinic back if she is unable to reach her therapist so that we can refer her to a therapist here.  Panic attacks- improving Prozac as prescribed  For  insomnia-unstable Increase trazodone to 75 mg p.o. nightly.  Patient has  not taken trazodone except for 2 occasions since her last visit with Clinical research associate.  Since she continues to struggle with sleep discussed with her to start taking the medication regularly.  Pending records-labs TSH.  Pending records from her therapist- Ms. Margorie John  Noncompliance with medication-unstable Some time was spent today providing education about the need for compliance.  We will continue to reassess patient during next session.  Follow-up in clinic in 10 days or sooner if needed.  Appointment scheduled for June 10 at 10:15 AM.  I have spent atleast 25 minutes non face to face with patient today. More than 50 % of the time was spent for psychoeducation and supportive psychotherapy and care coordination.  This note was generated in part or whole with voice recognition software. Voice recognition is usually quite accurate but there are transcription errors that can and very often do occur. I apologize for any typographical errors that were not detected and corrected.        Jomarie Longs, MD 01/11/2019, 12:46 PM

## 2019-01-20 IMAGING — US US TRANSVAGINAL NON-OB
1 series · 13 of 25 positions shown · non-contrast
Comparison: None

CLINICAL DATA: 35 y/o F; 1 year of chronic pelvic pain on and off
recommenced 1 week ago.

EXAM:
TRANSABDOMINAL AND TRANSVAGINAL ULTRASOUND OF PELVIS
TECHNIQUE: Both transabdominal and transvaginal ultrasound examinations of the
pelvis were performed. Transabdominal technique was performed for
global imaging of the pelvis including uterus, ovaries, adnexal
regions, and pelvic cul-de-sac. It was necessary to proceed with
endovaginal exam following the transabdominal exam to visualize the
uterus and endometrium.

[Series 1: us transvaginal non-ob · 0.23mm/px · 13 of 88 slices shown]
[im 1/88]
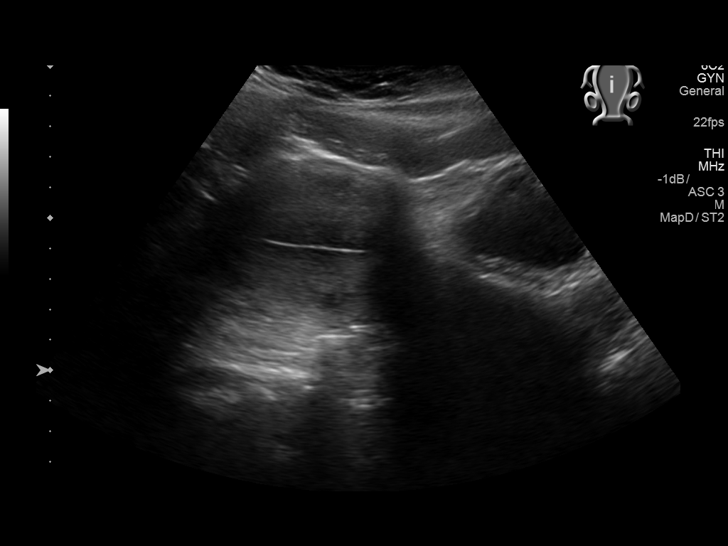
[im 8/88]
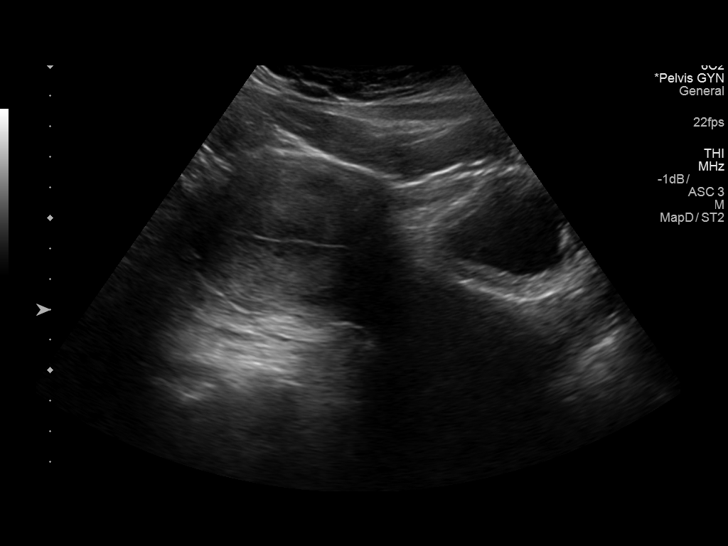
[im 15/88]
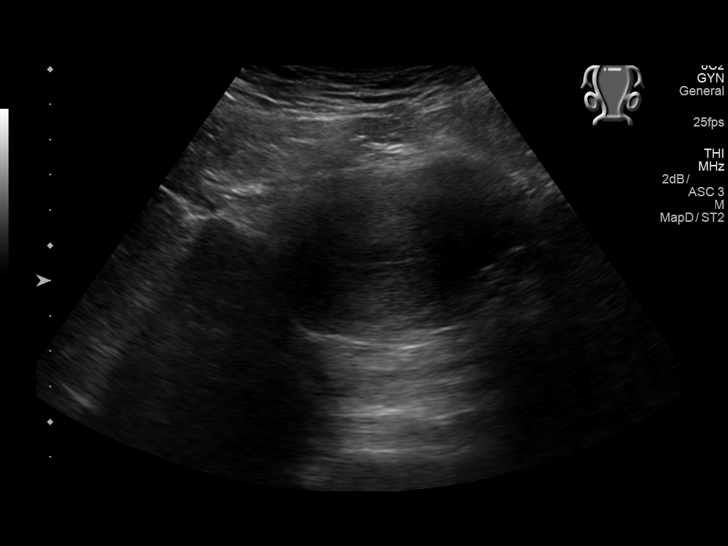
[im 22/88]
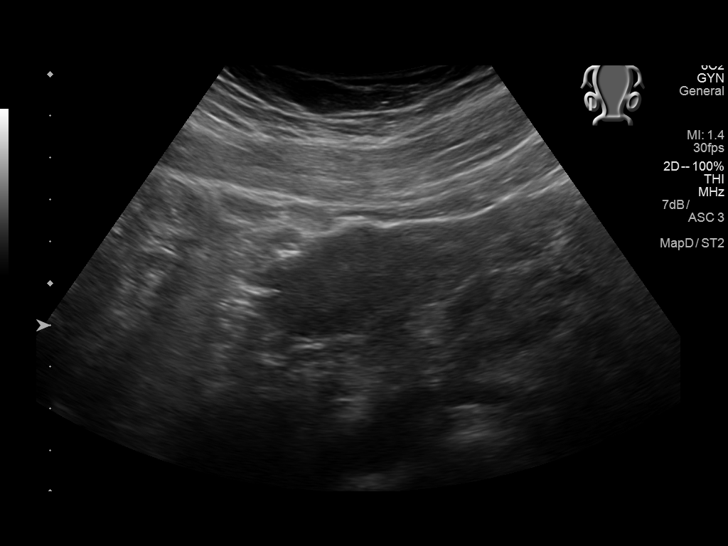
[im 30/88]
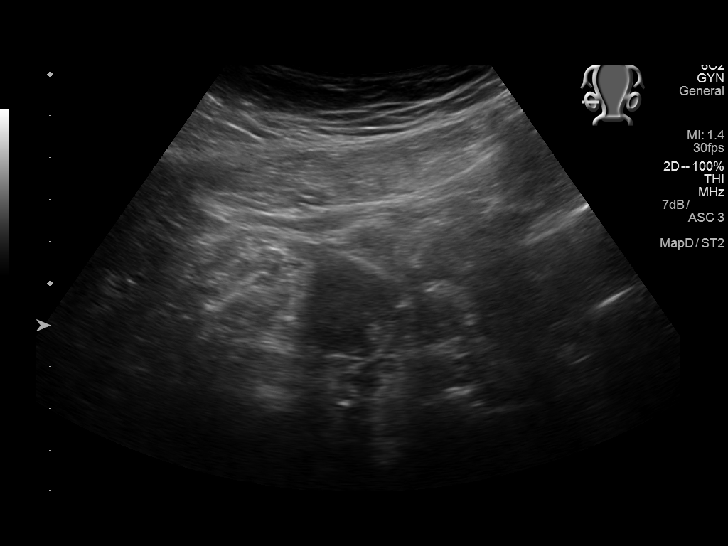
[im 37/88]
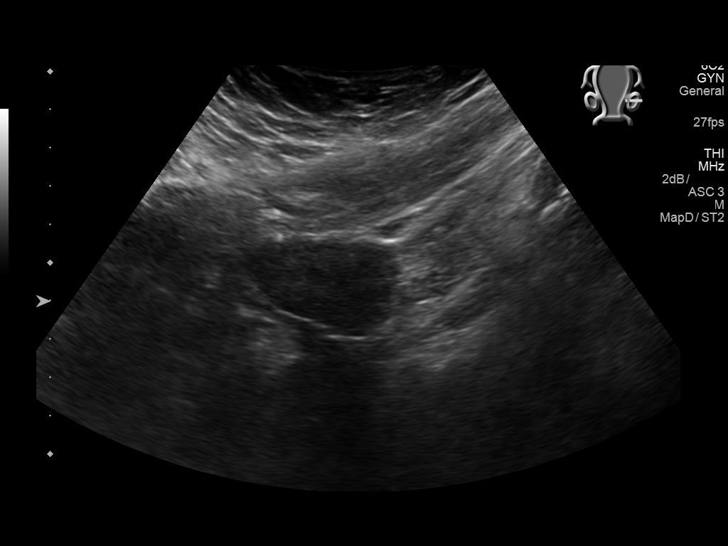
[im 44/88]
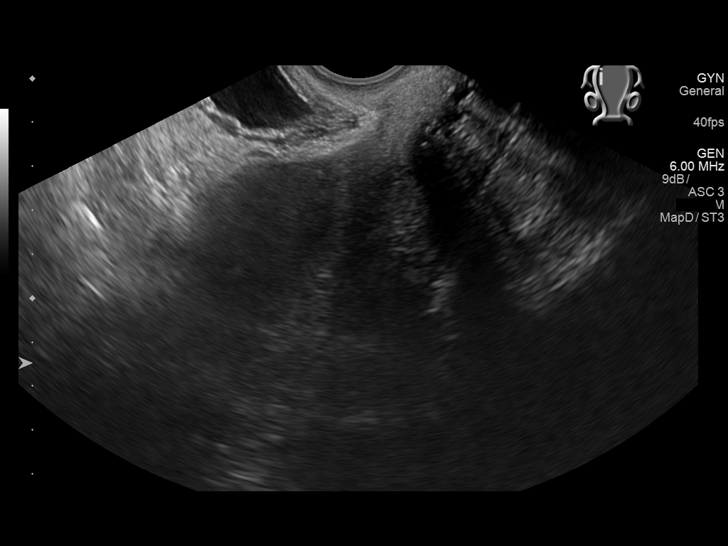
[im 51/88]
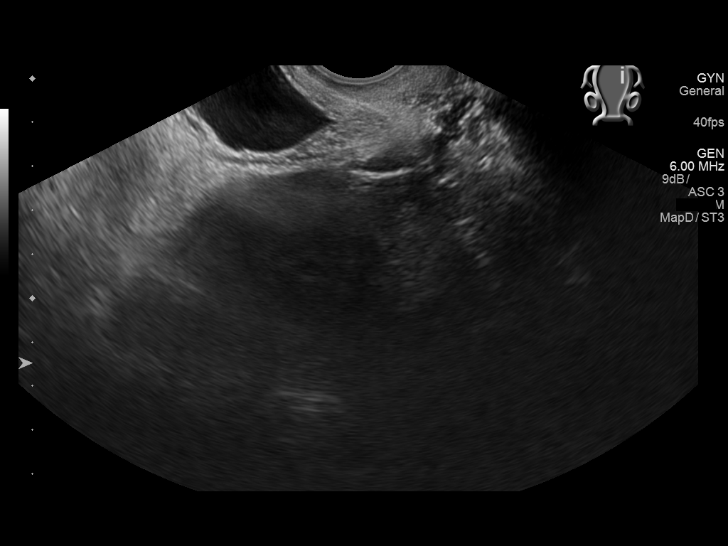
[im 59/88]
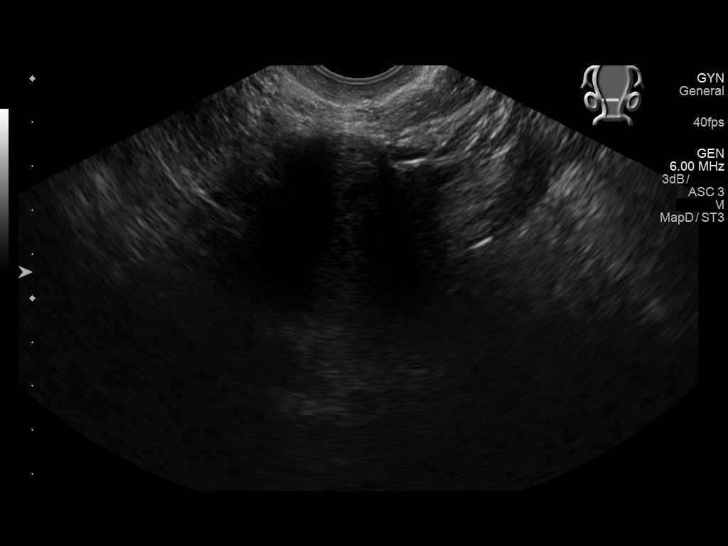
[im 66/88]
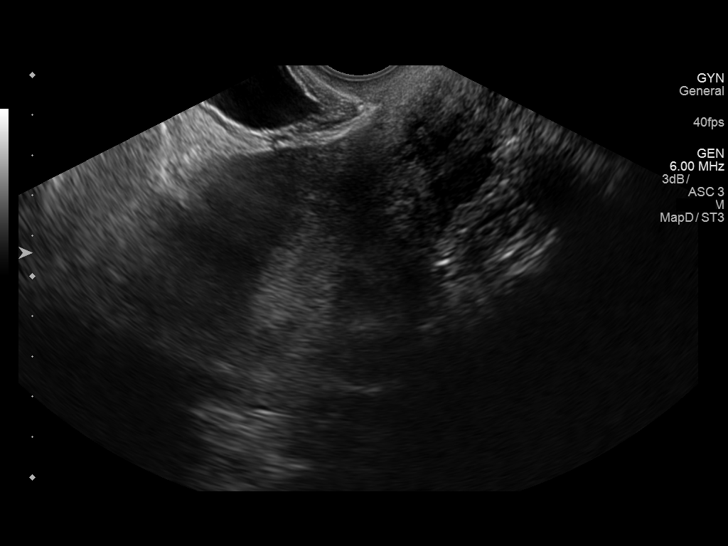
[im 73/88]
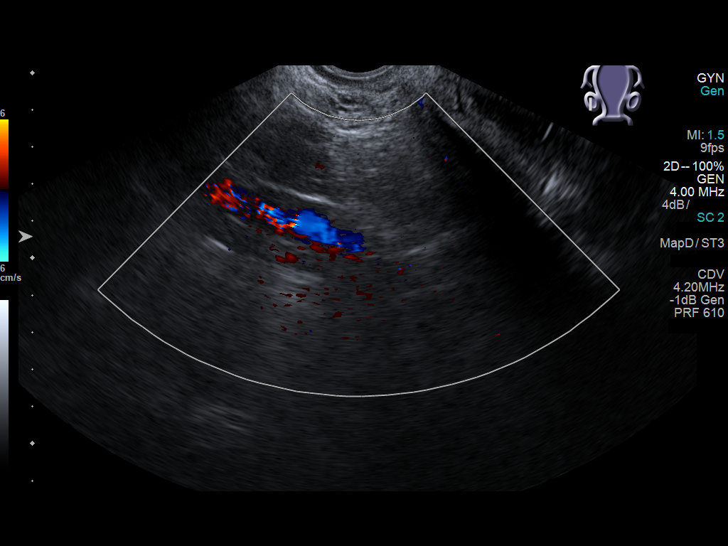
[im 80/88]
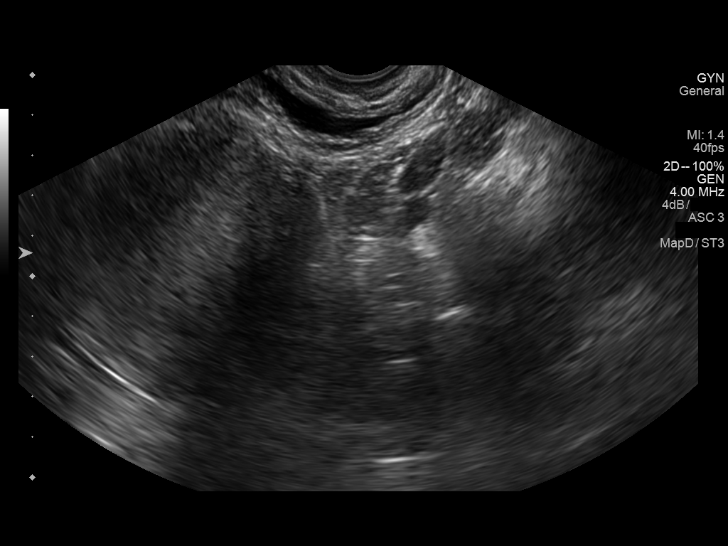
[im 88/88]
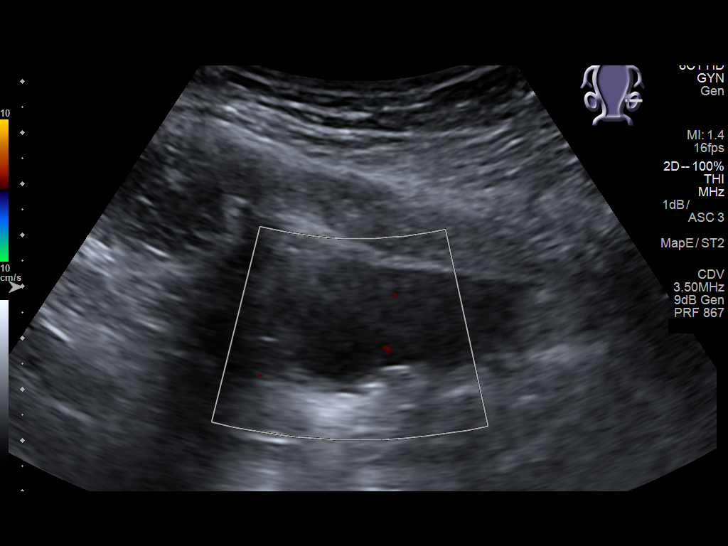

[13 of 25 positions shown; findings below may reference images not displayed]

FINDINGS: Uterus

Measurements: 13.3 x 4.7 x 6.5 cm. No fibroids or other mass
visualized.

Endometrium

Thickness: 19.8 mm.  Trace free fluid within the cervix.

Right ovary

Measurements: 3.8 x 2.4 x 2.7 cm. Normal appearance/no adnexal mass.

Left ovary

Measurements: 5.1 x 2.6 x 3.9 cm. Benign-appearing cyst measuring
1.8 x 1.5 x 1.5 cm.

Other findings

No abnormal free fluid.
IMPRESSION: Endometrial thickening measuring up to 20 mm. Endometrial thickness
is considered abnormal. Consider follow-up by US in 6-8 weeks,
during the week immediately following menses (exam timing is
critical). Otherwise unremarkable pelvic ultrasound.

By: Amesawu Luther King M.D.

## 2019-01-23 ENCOUNTER — Encounter: Payer: Self-pay | Admitting: Psychiatry

## 2019-01-23 ENCOUNTER — Other Ambulatory Visit: Payer: Self-pay

## 2019-01-23 ENCOUNTER — Ambulatory Visit (INDEPENDENT_AMBULATORY_CARE_PROVIDER_SITE_OTHER): Payer: Non-veteran care | Admitting: Psychiatry

## 2019-01-23 DIAGNOSIS — F410 Panic disorder [episodic paroxysmal anxiety] without agoraphobia: Secondary | ICD-10-CM

## 2019-01-23 DIAGNOSIS — Z9114 Patient's other noncompliance with medication regimen: Secondary | ICD-10-CM | POA: Diagnosis not present

## 2019-01-23 DIAGNOSIS — F4310 Post-traumatic stress disorder, unspecified: Secondary | ICD-10-CM

## 2019-01-23 DIAGNOSIS — F5105 Insomnia due to other mental disorder: Secondary | ICD-10-CM | POA: Diagnosis not present

## 2019-01-23 MED ORDER — BUSPIRONE HCL 10 MG PO TABS: 10.0000 mg | ORAL_TABLET | Freq: Two times a day (BID) | ORAL | 1 refills | Status: AC

## 2019-01-23 MED ORDER — TRAZODONE HCL 100 MG PO TABS: 100.0000 mg | ORAL_TABLET | Freq: Every day | ORAL | 0 refills | Status: AC

## 2019-01-23 MED ORDER — FLUOXETINE HCL 40 MG PO CAPS: 40.0000 mg | ORAL_CAPSULE | Freq: Every day | ORAL | 1 refills | Status: AC

## 2019-01-23 NOTE — Progress Notes (Signed)
Virtual Visit via Video Note  I connected with Melissa Bond on 01/23/19 at 10:15 AM EDT by a video enabled telemedicine application and verified that I am speaking with the correct person using two identifiers.   I discussed the limitations of evaluation and management by telemedicine and the availability of in person appointments. The patient expressed understanding and agreed to proceed.   I discussed the assessment and treatment plan with the patient. The patient was provided an opportunity to ask questions and all were answered. The patient agreed with the plan and demonstrated an understanding of the instructions.   The patient was advised to call back or seek an in-person evaluation if the symptoms worsen or if the condition fails to improve as anticipated.   [facility] MD OP Progress Note  01/23/2019 1:19 PM Melissa Bond  MRN:  [id number]  Chief Complaint:  Chief Complaint    Follow-up     HPI: Melissa Bond is a 38 year old African-American female, single, employed, has a history of PTSD, panic attacks, insomnia, lives in [place] was evaluated by telemedicine today.  Patient today reports that she has been noticing some progress in her mood recently.  She reports she has been able to pick up some more chores around the house, take care of herself.  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  That also has been helpful.  She was actually able to get out and get her hair done.  These are all improvement since the last visit.  She however reports she had a recent panic attack when she felt discomfort in her abdomen and also threw up a few times.  This has not happened in a long time and it happened after she had some contact with her ex-boyfriend.  Reports she was able to cope with it by using her breathing techniques.  Patient today appeared to be hyperverbal and talkative.  This is her baseline.   She however has been denying any hypomanic or manic episodes however will continue to monitor her closely for possible symptoms of bipolar disorder.  Patient denies any suicidality, homicidality or perceptual disturbances.  Patient reports sleep continues to be restless and hence discussed increasing her trazodone.  She agreed with plan.  Patient reports she has not started psychotherapy sessions again with her therapist and has left messages for her therapist, and is waiting to hear back.  Discussed with patient again that we need records from her therapist.  She agrees to obtain it.  Patient denies any other concerns today.   Visit Diagnosis:    ICD-10-CM   1. PTSD (post-traumatic stress disorder) F43.10 busPIRone (BUSPAR) 10 MG tablet    FLUoxetine (PROZAC) 40 MG capsule  2. Panic attacks F41.0 busPIRone (BUSPAR) 10 MG tablet  3. Insomnia due to mental condition F51.05 traZODone (DESYREL) 100 MG tablet  4. Noncompliance with medication regimen Z91.14     Past Psychiatric History: I have reviewed past psychiatric history from my progress note on 12/13/2018.  Past trials of Prozac, trazodone, Paxil, Intuniv, BuSpar.  Past Medical History:  Past Medical History:  Diagnosis Date  . Allergy   . Anxiety   . Bronchitis   . Depression   . IBS (irritable bowel syndrome)   . PTSD (post-traumatic stress disorder)     Past Surgical History:  Procedure Laterality Date  . APPENDECTOMY    . BREAST BIOPSY Left  benign  . CESAREAN SECTION      Family Psychiatric History: Reviewed family psychiatric history from my progress note on 12/13/2018  Family History:  Family History  Problem Relation Age of Onset  . Alcohol abuse Mother   . Anxiety disorder Mother   . Depression Mother   . Alcohol abuse Father   . Alcohol abuse Maternal Grandfather   . Alcohol abuse Paternal Grandfather     Social History: I have reviewed social history from my progress note on 12/13/2018. Social  History   Socioeconomic History  . Marital status: Single    Spouse name: Not on file  . Number of children: 1  . Years of education: Not on file  . Highest education level: Some college, no degree  Occupational History  . Not on file  Social Needs  . Financial resource strain: Not hard at all  . Food insecurity:    Worry: Never true    Inability: Never true  . Transportation needs:    Medical: No    Non-medical: No  Tobacco Use  . Smoking status: Current Every Day Smoker    Packs/day: 0.00    Years: 15.00    Pack years: 0.00    Types: Cigars  . Smokeless tobacco: Never Used  Substance and Sexual Activity  . Alcohol use: Yes    Alcohol/week: 20.0 - 25.0 standard drinks    Types: 14 Glasses of wine, 1 Cans of beer, 5 - 10 Shots of liquor per week  . Drug use: No  . Sexual activity: Yes    Birth control/protection: Pill  Lifestyle  . Physical activity:    Days per week: 0 days    Minutes per session: 0 min  . Stress: Very much  Relationships  . Social connections:    Talks on phone: Not on file    Gets together: Not on file    Attends religious service: More than 4 times per year    Active member of club or organization: Yes    Attends meetings of clubs or organizations: More than 4 times per year    Relationship status: Separated  Other Topics Concern  . Not on file  Social History Narrative  . Not on file    Allergies:  Allergies  Allergen Reactions  . Doxapap-N [Propoxyphene]   . Doxycycline Rash    Metabolic Disorder Labs: No results found for: HGBA1C, MPG No results found for: PROLACTIN No results found for: CHOL, TRIG, HDL, CHOLHDL, VLDL, LDLCALC No results found for: TSH  Therapeutic Level Labs: No results found for: LITHIUM No results found for: VALPROATE No components found for:  CBMZ  Current Medications: Current Outpatient Medications  Medication Sig Dispense Refill  . busPIRone (BUSPAR) 10 MG tablet Take 1 tablet (10 mg total) by mouth  2 (two) times daily. 60 tablet 1  . FLUoxetine (PROZAC) 40 MG capsule Take 1 capsule (40 mg total) by mouth daily. 30 capsule 1  . Multiple Vitamin (MULTIVITAMIN) capsule Take 1 capsule by mouth daily.    . traZODone (DESYREL) 100 MG tablet Take 1 tablet (100 mg total) by mouth at bedtime. 90 tablet 0  . Vitamin D, Ergocalciferol, (DRISDOL) 50000 units CAPS capsule Take 50,000 Units by mouth every 7 (seven) days.     No current facility-administered medications for this visit.      Musculoskeletal: Strength & Muscle Tone: within normal limits Gait & Station: normal Patient leans: N/A  Psychiatric Specialty Exam: Review of Systems  Psychiatric/Behavioral: The patient is nervous/anxious and has insomnia.   All other systems reviewed and are negative.   There were no vitals taken for this visit.There is no height or weight on file to calculate BMI.  General Appearance: Casual  Eye Contact:  Fair  Speech:  Clear and Coherent  Volume:  Normal  Mood:  Anxious  Affect:  Congruent  Thought Process:  Goal Directed and Descriptions of Associations: Intact  Orientation:  Full (Time, Place, and Person)  Thought Content: Logical   Suicidal Thoughts:  No  Homicidal Thoughts:  No  Memory:  Immediate;   Fair Recent;   Fair Remote;   Fair  Judgement:  Fair  Insight:  Fair  Psychomotor Activity:  Normal  Concentration:  Concentration: Fair and Attention Span: Fair  Recall:  [employer]  Fund of Knowledge: Fair  Language: Fair  Akathisia:  No  Handed:  Right  AIMS (if indicated): denies tremors, rigidity,stiffness  Assets:  Communication Skills Desire for Improvement Social Support  ADL's:  Intact  Cognition: WNL  Sleep:  Poor   Screenings:   Assessment and Plan: Melissa LagerYolanda is a 38 year old African-American female, single, employed, lives in [place], has a history of PTSD was evaluated by telemedicine today.  Patient is biologically predisposed given her history of trauma, family history  of mental health problems.  Patient with psychosocial stressors of being a single mother.  Patient continues to struggle with mood symptoms as well as sleep problems although making some progress.  Patient reports she is more compliant with her medications however has not been able to restart psychotherapy sessions yet.  Discussed plan as noted below.  Plan PTSD-some progress Continue Prozac 40 mg p.o. daily BuSpar 10 mg p.o. twice daily Patient advised to reach out to her therapist [facility] in [facility] to schedule an appointment.  Pending.  Panic attacks-improving Prozac as prescribed Patient also advised to restart CBT.  Offered to refer her to therapist here in clinic however she declined.  For insomnia-unstable Increase trazodone to 100 mg p.o. nightly  Noncompliance with treatment-some progress We will continue to monitor closely.  Pending labs-TSH  Pending records from her therapist-Ms. Margorie JohnPerry Vaughn   Follow-up in clinic in 4 weeks or sooner if needed.  July 7 at 4:15 PM.  I have spent atleast 15 minutes non face to face with patient today. More than 50 % of the time was spent for psychoeducation and supportive psychotherapy and care coordination.  This note was generated in part or whole with voice recognition software. Voice recognition is usually quite accurate but there are transcription errors that can and very often do occur. I apologize for any typographical errors that were not detected and corrected.        Jomarie LongsSaramma Shakina Choy, MD 01/23/2019, 1:19 PM

## 2019-02-12 ENCOUNTER — Telehealth: Payer: Self-pay

## 2019-02-12 NOTE — Telephone Encounter (Signed)
called pt she said she understood that but she was just wondering if you would write a letter keeping her out until  her next appt on  02-19-19  pt was last seen on  01-23-19

## 2019-02-12 NOTE — Telephone Encounter (Signed)
pt states that she still not able to work and she and her work is requesting that she has another note from doctor.

## 2019-02-12 NOTE — Telephone Encounter (Signed)
Please call her Melissa Bond and ask her what exactly she wants and please let her know we do not do FMLA here since she is not established .thanks

## 2019-02-12 NOTE — Telephone Encounter (Signed)
I need to evaluate her - pls schedule her for next available appointment. Or please ask her to go to ED if in a crisis.

## 2019-02-19 ENCOUNTER — Encounter: Payer: Self-pay | Admitting: Psychiatry

## 2019-02-19 ENCOUNTER — Ambulatory Visit (INDEPENDENT_AMBULATORY_CARE_PROVIDER_SITE_OTHER): Payer: Non-veteran care | Admitting: Psychiatry

## 2019-02-19 ENCOUNTER — Other Ambulatory Visit: Payer: Self-pay

## 2019-02-19 DIAGNOSIS — F410 Panic disorder [episodic paroxysmal anxiety] without agoraphobia: Secondary | ICD-10-CM

## 2019-02-19 DIAGNOSIS — F4310 Post-traumatic stress disorder, unspecified: Secondary | ICD-10-CM

## 2019-02-19 DIAGNOSIS — Z9114 Patient's other noncompliance with medication regimen: Secondary | ICD-10-CM | POA: Diagnosis not present

## 2019-02-19 DIAGNOSIS — F5105 Insomnia due to other mental disorder: Secondary | ICD-10-CM

## 2019-02-19 DIAGNOSIS — Z91148 Patient's other noncompliance with medication regimen for other reason: Secondary | ICD-10-CM

## 2019-02-19 MED ORDER — ARIPIPRAZOLE 2 MG PO TABS: 2.0000 mg | ORAL_TABLET | Freq: Every day | ORAL | 1 refills | Status: AC

## 2019-02-19 NOTE — Progress Notes (Signed)
Virtual Visit via Video Note  I connected with Melissa Bond on 02/19/19 at  4:15 PM EDT by a video enabled telemedicine application and verified that I am speaking with the correct person using two identifiers.   I discussed the limitations of evaluation and management by telemedicine and the availability of in person appointments. The patient expressed understanding and agreed to proceed.   I discussed the assessment and treatment plan with the patient. The patient was provided an opportunity to ask questions and all were answered. The patient agreed with the plan and demonstrated an understanding of the instructions.   The patient was advised to call back or seek an in-person evaluation if the symptoms worsen or if the condition fails to improve as anticipated.   [facility] MD OP Progress Note  02/19/2019 5:43 PM Melissa Bond  MRN:  [id number]  Chief Complaint:  Chief Complaint    Follow-up     HPI: Melissa Bond is a 38 year old African-American female, single, employed, has a history of PTSD, panic attacks, insomnia due to mental condition was evaluated by telemedicine today.  Patient today continues to report depressive symptoms.  She had described some increased energy and being unable to do chores around the house last visit.  However today she reports feeling sad, lack of motivation.  She denies any significant manic or hypomanic episode.  Patient reports she is sleeping better on the medication change.  She reports she currently does not work and does not know what is going on with her job at the [place].  She hence reports she feels like she does not have any long-term goals anymore.  She wants to find out what is going on with her job situation.  This worries her.  Patient was advised last visit and also the visit prior to that to start seeing her therapist again.  Patient today returns again stating that she has not been able to establish care with her therapist again.  She reports again that  she called and left messages and did not get any calls back.  Discussed with patient that we need to obtain information from her therapist as well as her previous mental health records from [place].  Patient to sign a release to obtain the same.  Patient denies any suicidality, homicidality or perceptual disturbances. Visit Diagnosis:    ICD-10-CM   1. PTSD (post-traumatic stress disorder)  F43.10 ARIPiprazole (ABILIFY) 2 MG tablet  2. 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 Noncompliance with medication regimen  Z91.14     Past Psychiatric History: I have reviewed past psychiatric history from my progress note on 12/13/2018.  Past trials of Prozac, trazodone, Paxil, Intuniv, BuSpar.  Past Medical History:  Past Medical History:  Diagnosis Date  . Allergy   . Anxiety   . Bronchitis   . Depression   . IBS (irritable bowel syndrome)   . PTSD (post-traumatic stress disorder)     Past Surgical History:  Procedure Laterality Date  . APPENDECTOMY    . BREAST BIOPSY Left    benign  . CESAREAN SECTION      Family Psychiatric History: I have reviewed family psychiatric history from my progress note on 12/13/2018.  Family History:  Family History  Problem Relation Age of Onset  . Alcohol abuse Mother   . Anxiety disorder Mother   . Depression Mother   . Alcohol abuse Father   . Alcohol abuse  Maternal Grandfather   . Alcohol abuse Paternal Grandfather     Social History: I have reviewed social history from my progress note on 12/13/2018. Social History   Socioeconomic History  . Marital status: Single    Spouse name: Not on file  . Number of children: 1  . Years of education: Not on file  . Highest education level: Some college, no degree  Occupational History  . Not on file  Social Needs  . Financial resource strain: Not hard at all  . Food insecurity    Worry: Never true    Inability: Never true  .  Transportation needs    Medical: No    Non-medical: No  Tobacco Use  . Smoking status: Current Every Day Smoker    Packs/day: 0.00    Years: 15.00    Pack years: 0.00    Types: Cigars  . Smokeless tobacco: Never Used  Substance and Sexual Activity  . Alcohol use: Yes    Alcohol/week: 20.0 - 25.0 standard drinks    Types: 14 Glasses of wine, 1 Cans of beer, 5 - 10 Shots of liquor per week  . Drug use: No  . Sexual activity: Yes    Birth control/protection: Pill  Lifestyle  . Physical activity    Days per week: 0 days    Minutes per session: 0 min  . Stress: Very much  Relationships  . Social Musicianconnections    Talks on phone: Not on file    Gets together: Not on file    Attends religious service: More than 4 times per year    Active member of club or organization: Yes    Attends meetings of clubs or organizations: More than 4 times per year    Relationship status: Separated  Other Topics Concern  . Not on file  Social History Narrative  . Not on file    Allergies:  Allergies  Allergen Reactions  . Doxapap-N [Propoxyphene]   . Doxycycline Rash    Metabolic Disorder Labs: No results found for: HGBA1C, MPG No results found for: PROLACTIN No results found for: CHOL, TRIG, HDL, CHOLHDL, VLDL, LDLCALC No results found for: TSH  Therapeutic Level Labs: No results found for: LITHIUM No results found for: VALPROATE No components found for:  CBMZ  Current Medications: Current Outpatient Medications  Medication Sig Dispense Refill  . ARIPiprazole (ABILIFY) 2 MG tablet Take 1 tablet (2 mg total) by mouth daily. 30 tablet 1  . busPIRone (BUSPAR) 10 MG tablet Take 1 tablet (10 mg total) by mouth 2 (two) times daily. 60 tablet 1  . FLUoxetine (PROZAC) 40 MG capsule Take 1 capsule (40 mg total) by mouth daily. 30 capsule 1  . Multiple Vitamin (MULTIVITAMIN) capsule Take 1 capsule by mouth daily.    . traZODone (DESYREL) 100 MG tablet Take 1 tablet (100 mg total) by mouth at  bedtime. 90 tablet 0  . Vitamin D, Ergocalciferol, (DRISDOL) 50000 units CAPS capsule Take 50,000 Units by mouth every 7 (seven) days.     No current facility-administered medications for this visit.      Musculoskeletal: Strength & Muscle Tone: within normal limits Gait & Station: normal Patient leans: N/A  Psychiatric Specialty Exam: Review of Systems  Psychiatric/Behavioral: Positive for depression.  All other systems reviewed and are negative.   There were no vitals taken for this visit.There is no height or weight on file to calculate BMI.  General Appearance: Casual  Eye Contact:  Fair  Speech:  Clear and Coherent  Volume:  Normal  Mood:  Depressed  Affect:  Appropriate  Thought Process:  Goal Directed and Descriptions of Associations: Intact  Orientation:  Full (Time, Place, and Person)  Thought Content: Logical   Suicidal Thoughts:  No  Homicidal Thoughts:  No  Memory:  Immediate;   Fair Recent;   Fair Remote;   Fair  Judgement:  Fair  Insight:  Fair  Psychomotor Activity:  Normal  Concentration:  Concentration: Fair and Attention Span: Fair  Recall:  FiservFair  Fund of Knowledge: Fair  Language: Fair  Akathisia:  No  Handed:  Right  AIMS (if indicated): denies tremors, rigidity  Assets:  Communication Skills Desire for Improvement Social Support  ADL's:  Intact  Cognition: WNL  Sleep:  improving   Screenings:   Assessment and Plan: Melissa Bond is a 38 year old African-American female, single, employed, lives in [place], has a history of PTSD was evaluated by telemedicine today.  Patient is biologically predisposed given her history of trauma, family history of mental health problems.  Patient with psychosocial stressors of being single mother, as well as work-related problems.  Patient however continues to be noncompliant with recommendations to start psychotherapy sessions.  Will continue to need medication changes.  Plan PTSD-some improvement Prozac 40 mg  p.o. daily BuSpar 10 mg p.o. twice daily Start Abilify 2 mg p.o. daily.  For panic attacks-improving Prozac as prescribed Patient also advised to restart CBT-however has been noncompliant.  For insomnia- improving Trazodone 100 mg p.o. nightly  For noncompliance with treatment- patient advised to sign a release to obtain medical records from therapist Margorie Johnerry Vaughn as well as her previous mental health records.  Also discussed referral for intensive outpatient program.  She will try to reach out to her therapist again.  Follow-up in clinic in 4 weeks or sooner if needed.  August 17 at 4:15 PM  I have spent atleast 15 minutes non- face to face with patient today. More than 50 % of the time was spent for psychoeducation and supportive psychotherapy and care coordination.  This note was generated in part or whole with voice recognition software. Voice recognition is usually quite accurate but there are transcription errors that can and very often do occur. I apologize for any typographical errors that were not detected and corrected.        Jomarie LongsSaramma Numan Zylstra, MD 02/19/2019, 5:43 PM

## 2019-02-20 ENCOUNTER — Telehealth: Payer: Self-pay | Admitting: Psychiatry

## 2019-02-20 NOTE — Telephone Encounter (Signed)
Reviewed records from [place] - from Primary care. Reviewed labs - AST- wn; ALT - low at 10 Hba1c- 4.4 Signed 10/22/2018

## 2019-04-01 ENCOUNTER — Other Ambulatory Visit: Payer: Self-pay

## 2019-04-01 ENCOUNTER — Ambulatory Visit (INDEPENDENT_AMBULATORY_CARE_PROVIDER_SITE_OTHER): Payer: Non-veteran care | Admitting: Psychiatry

## 2019-04-01 DIAGNOSIS — Z5329 Procedure and treatment not carried out because of patient's decision for other reasons: Secondary | ICD-10-CM

## 2019-04-01 NOTE — Progress Notes (Signed)
No response to call 

## 2023-02-13 ENCOUNTER — Other Ambulatory Visit: Payer: Self-pay | Admitting: Family

## 2023-02-13 DIAGNOSIS — Z1231 Encounter for screening mammogram for malignant neoplasm of breast: Secondary | ICD-10-CM

## 2023-02-28 ENCOUNTER — Ambulatory Visit
Admission: RE | Admit: 2023-02-28 | Discharge: 2023-02-28 | Disposition: A | Discharge status: Home / Self Care | Payer: No Typology Code available for payment source | Source: Ambulatory Visit | Attending: Family | Admitting: Family

## 2023-02-28 DIAGNOSIS — Z1231 Encounter for screening mammogram for malignant neoplasm of breast: Secondary | ICD-10-CM | POA: Insufficient documentation

## 2023-03-02 ENCOUNTER — Other Ambulatory Visit: Payer: Self-pay | Admitting: *Deleted

## 2023-03-02 ENCOUNTER — Inpatient Hospital Stay
Admission: RE | Admit: 2023-03-02 | Discharge: 2023-03-02 | Disposition: A | Discharge status: Home / Self Care | Payer: Self-pay | Source: Ambulatory Visit | Attending: Family | Admitting: Family

## 2023-03-02 DIAGNOSIS — Z1231 Encounter for screening mammogram for malignant neoplasm of breast: Secondary | ICD-10-CM

## 2023-06-06 ENCOUNTER — Emergency Department
Admission: EM | Admit: 2023-06-06 | Discharge: 2023-06-06 | Disposition: A | Discharge status: Other Acute Inpatient Hospital | Payer: No Typology Code available for payment source | Attending: Emergency Medicine | Admitting: Emergency Medicine

## 2023-06-06 ENCOUNTER — Emergency Department: Payer: No Typology Code available for payment source

## 2023-06-06 ENCOUNTER — Encounter: Payer: Self-pay | Admitting: Emergency Medicine

## 2023-06-06 DIAGNOSIS — O009 Unspecified ectopic pregnancy without intrauterine pregnancy: Secondary | ICD-10-CM | POA: Diagnosis not present

## 2023-06-06 DIAGNOSIS — R102 Pelvic and perineal pain: Secondary | ICD-10-CM | POA: Diagnosis not present

## 2023-06-06 DIAGNOSIS — O00202 Left ovarian pregnancy without intrauterine pregnancy: Secondary | ICD-10-CM

## 2023-06-06 DIAGNOSIS — O26891 Other specified pregnancy related conditions, first trimester: Secondary | ICD-10-CM | POA: Diagnosis present

## 2023-06-06 DIAGNOSIS — Z3A01 Less than 8 weeks gestation of pregnancy: Secondary | ICD-10-CM | POA: Diagnosis not present

## 2023-06-06 DIAGNOSIS — O26899 Other specified pregnancy related conditions, unspecified trimester: Secondary | ICD-10-CM

## 2023-06-06 LAB — URINALYSIS, ROUTINE W REFLEX MICROSCOPIC
Bilirubin Urine: NEGATIVE
Glucose, UA: NEGATIVE mg/dL
Ketones, ur: NEGATIVE mg/dL
Leukocytes,Ua: NEGATIVE
Nitrite: NEGATIVE
Protein, ur: NEGATIVE mg/dL
Specific Gravity, Urine: 1.015 (ref 1.005–1.030)
pH: 6 (ref 5.0–8.0)

## 2023-06-06 LAB — CBC WITH DIFFERENTIAL/PLATELET
Abs Immature Granulocytes: 0.02 10*3/uL (ref 0.00–0.07)
Basophils Absolute: 0 10*3/uL (ref 0.0–0.1)
Basophils Relative: 0 %
Eosinophils Absolute: 0 10*3/uL (ref 0.0–0.5)
Eosinophils Relative: 0 %
HCT: 32 % — ABNORMAL LOW (ref 36.0–46.0)
Hemoglobin: 9.9 g/dL — ABNORMAL LOW (ref 12.0–15.0)
Immature Granulocytes: 0 %
Lymphocytes Relative: 16 %
Lymphs Abs: 0.8 10*3/uL (ref 0.7–4.0)
MCH: 30.1 pg (ref 26.0–34.0)
MCHC: 30.9 g/dL (ref 30.0–36.0)
MCV: 97.3 fL (ref 80.0–100.0)
Monocytes Absolute: 0.3 10*3/uL (ref 0.1–1.0)
Monocytes Relative: 7 %
Neutro Abs: 3.9 10*3/uL (ref 1.7–7.7)
Neutrophils Relative %: 77 %
Platelets: 271 10*3/uL (ref 150–400)
RBC: 3.29 MIL/uL — ABNORMAL LOW (ref 3.87–5.11)
RDW: 14.6 % (ref 11.5–15.5)
WBC: 5.1 10*3/uL (ref 4.0–10.5)
nRBC: 0 % (ref 0.0–0.2)

## 2023-06-06 LAB — COMPREHENSIVE METABOLIC PANEL
ALT: 22 U/L (ref 0–44)
AST: 19 U/L (ref 15–41)
Albumin: 4 g/dL (ref 3.5–5.0)
Alkaline Phosphatase: 46 U/L (ref 38–126)
Anion gap: 8 (ref 5–15)
BUN: 10 mg/dL (ref 6–20)
CO2: 26 mmol/L (ref 22–32)
Calcium: 9.4 mg/dL (ref 8.9–10.3)
Chloride: 103 mmol/L (ref 98–111)
Creatinine, Ser: 0.76 mg/dL (ref 0.44–1.00)
GFR, Estimated: >60 mL/min (ref >60)
Glucose, Bld: 120 mg/dL — ABNORMAL HIGH (ref 70–99)
Potassium: 3.3 mmol/L — ABNORMAL LOW (ref 3.5–5.1)
Sodium: 137 mmol/L (ref 135–145)
Total Bilirubin: 0.6 mg/dL (ref 0.3–1.2)
Total Protein: 7.2 g/dL (ref 6.5–8.1)

## 2023-06-06 LAB — TYPE AND SCREEN
ABO/RH(D): B POS
Antibody Screen: NEGATIVE

## 2023-06-06 LAB — HCG, QUANTITATIVE, PREGNANCY: hCG, Beta Chain, Quant, S: 4142 m[IU]/mL — ABNORMAL HIGH (ref <5)

## 2023-06-06 LAB — PROTIME-INR
INR: 1.1 (ref 0.8–1.2)
Prothrombin Time: 14.9 s (ref 11.4–15.2)

## 2023-06-06 MED ORDER — ONDANSETRON HCL 4 MG/2ML IJ SOLN
4.0000 mg | Freq: Once | INTRAMUSCULAR | Status: AC
  Administered 2023-06-06: 4 mg via INTRAVENOUS
  Filled 2023-06-06: qty 2

## 2023-06-06 MED ORDER — SODIUM CHLORIDE 0.9 % IV BOLUS (SEPSIS)
1000.0000 mL | Freq: Once | INTRAVENOUS | Status: AC
  Administered 2023-06-06: 1000 mL via INTRAVENOUS

## 2023-06-06 MED ORDER — MORPHINE SULFATE (PF) 4 MG/ML IV SOLN
4.0000 mg | Freq: Once | INTRAVENOUS | Status: AC
  Administered 2023-06-06: 4 mg via INTRAVENOUS
  Filled 2023-06-06: qty 1

## 2023-06-06 NOTE — ED Notes (Signed)
Pt taken to US at this time

## 2023-06-06 NOTE — ED Notes (Signed)
[facility] reviewed by this RN. Pt signed paper consent for transfer.

## 2023-06-06 NOTE — ED Triage Notes (Signed)
Pt with c/o LLQ abdominal cramping, pain and distension that radiates around to area below umbilical. Pt with recent D&C on 10/12 and has received 2 Methotrexate injections since. Pt with continued pain and vaginal bleeding since. Pt reports multiple Ultra Sounds performed without visibility of pregnancy as well as limited visibility of uterus.

## 2023-06-06 NOTE — ED Provider Notes (Signed)
[facility] Provider Note    Event Date/Time   First MD Initiated Contact with Patient 06/06/23 [phone]     (approximate)   History   Abdominal Pain   HPI  Oddie Hwang is a 42 y.o. female  [phone] who presents to the emergency department with left lower pelvic pain that started tonight.  Patient states she was seen for pregnancy at [facility] on October 5 and had an hCG of 348.  She states she had her first ultrasound on that date that did not show a location of her pregnancy.  Patient then went to [facility] on October 9 and had a hCG of 1079 and had her second ultrasound that again did not show the location of her pregnancy.  Patient [facility] 12 but states they were not able to find a sac.  She was given her first dose of methotrexate for presumed ectopic pregnancy on October 13.  She states she took her second dose of methotrexate yesterday on the 21st.  Last night she started having increased left lower pelvic pain.  No fevers, vaginal discharge, vomiting or diarrhea.  She has had bright red vaginal bleeding since her D&C on the 12th but not passing clots or tissue.  LMP 04/15/23.  N.p.o. since 10 PM.   [facility], no IUP visualized limited by fibroid uterus >> 10/9 ED visit for cramping, HCG 1729, limited TVUS again without IUP >> PP visit 10/12 TVUS unable to identify IUP, D&C performed with questionable/negative products therefore sent to [facility] ED to rule out ectopic >> 10/12 2291 >> 10/13 3094, first dose of methotrexate >> 10/16 3715 >> 10/19 3705 >> 10/21 second dose of methotrexate   History provided by patient, family, EMS.    Past Medical History:  Diagnosis Date   Allergy    Anxiety    Bronchitis    Depression    IBS (irritable bowel syndrome)    PTSD (post-traumatic stress disorder)     Past Surgical History:  Procedure Laterality Date    APPENDECTOMY     BREAST BIOPSY Left    benign   CESAREAN SECTION      MEDICATIONS:  Prior to Admission medications   Medication Sig Start Date End Date Taking? Authorizing Provider  ARIPiprazole (ABILIFY) 2 MG tablet Take 1 tablet (2 mg total) by mouth daily. 02/19/19   Jomarie Longs, MD  busPIRone (BUSPAR) 10 MG tablet Take 1 tablet (10 mg total) by mouth 2 (two) times daily. 01/23/19   Jomarie Longs, MD  FLUoxetine (PROZAC) 40 MG capsule Take 1 capsule (40 mg total) by mouth daily. 01/23/19   Jomarie Longs, MD  Multiple Vitamin (MULTIVITAMIN) capsule Take 1 capsule by mouth daily.    [provider]  traZODone (DESYREL) 100 MG tablet Take 1 tablet (100 mg total) by mouth at bedtime. 01/23/19   Jomarie Longs, MD  Vitamin D, Ergocalciferol, (DRISDOL) 50000 units CAPS capsule Take 50,000 Units by mouth every 7 (seven) days.    [provider]    Physical Exam   Triage Vital Signs: ED Triage Vitals  Encounter Vitals Group     BP 06/06/23 0234 (!) 141/79     Systolic BP Percentile --      Diastolic BP Percentile --      Pulse Rate 06/06/23 0234 93     Resp 06/06/23 0234 18     Temp 06/06/23  0234 98.4 F (36.9 C)     Temp src --      SpO2 06/06/23 0234 100 %     Weight 06/06/23 0231 213 lb (96.6 kg)     Height 06/06/23 0231 5\' 5"  (1.651 m)     Head Circumference --      Peak Flow --      Pain Score 06/06/23 0231 9     Pain Loc --      Pain Education --      Exclude from Growth Chart --     Most recent vital signs: Vitals:   06/06/23 0430 06/06/23 0639  BP: 130/65 123/66  Pulse: 86 70  Resp: 19 20  Temp:  98.6 F (37 C)  SpO2: 99% 98%    CONSTITUTIONAL: Alert, responds appropriately to questions. Well-appearing; well-nourished HEAD: Normocephalic, atraumatic EYES: Conjunctivae clear, pupils appear equal, sclera nonicteric ENT: normal nose; moist mucous membranes NECK: Supple, normal ROM CARD: RRR; S1 and S2 appreciated RESP: Normal chest  excursion without splinting or tachypnea; breath sounds clear and equal bilaterally; no wheezes, no rhonchi, no rales, no hypoxia or respiratory distress, speaking full sentences ABD/GI: Non-distended; soft, tender to palpation in the left pelvic region without guarding or rebound BACK: The back appears normal EXT: Normal ROM in all joints; no deformity noted, no edema SKIN: Normal color for age and race; warm; no rash on exposed skin NEURO: Moves all extremities equally, normal speech PSYCH: The patient's mood and manner are appropriate.   ED Results / Procedures / Treatments   LABS: (all labs ordered are listed, but only abnormal results are displayed) Labs Reviewed  HCG, QUANTITATIVE, PREGNANCY - Abnormal; Notable for the following components:      Result Value   hCG, Beta Chain, Quant, S 4,142 (*)    All other components within normal limits  URINALYSIS, ROUTINE W REFLEX MICROSCOPIC - Abnormal; Notable for the following components:   Color, Urine YELLOW (*)    APPearance CLEAR (*)    Hgb urine dipstick MODERATE (*)    Bacteria, UA RARE (*)    All other components within normal limits  CBC WITH DIFFERENTIAL/PLATELET - Abnormal; Notable for the following components:   RBC 3.29 (*)    Hemoglobin 9.9 (*)    HCT 32.0 (*)    All other components within normal limits  COMPREHENSIVE METABOLIC PANEL - Abnormal; Notable for the following components:   Potassium 3.3 (*)    Glucose, Bld 120 (*)    All other components within normal limits  PROTIME-INR  TYPE AND SCREEN     EKG:  EKG Interpretation Date/Time:    Ventricular Rate:    PR Interval:    QRS Duration:    QT Interval:    QTC Calculation:   R Axis:      Text Interpretation:           RADIOLOGY: My personal review and interpretation of imaging: Ultrasound concerning for left ectopic pregnancy without signs of rupture.  I have personally reviewed all radiology reports.   US OB LESS THAN 14 WEEKS W/ OB  TRANSVAGINAL AND DOPPLER  Addendum Date: 06/06/2023   ADDENDUM REPORT: 06/06/2023 06:56 ADDENDUM: Critical Value/emergent results were called by telephone at the time of interpretation on 06/06/2023 at 6:56 am to provider [facility] , who verbally acknowledged these results. Electronically Signed   By: Signa Kell M.D.   On: 06/06/2023 06:56   Result Date: 06/06/2023 CLINICAL DATA:  Pelvic pain.  Status post D and C. EXAM: OBSTETRIC <14 WK [place] AND TRANSVAGINAL OB US DOPPLER ULTRASOUND OF OVARIES TECHNIQUE: Both transabdominal and transvaginal ultrasound examinations were performed for complete evaluation of the gestation as well as the maternal uterus, adnexal regions, and pelvic cul-de-sac. Transvaginal technique was performed to assess early pregnancy. Color and duplex Doppler ultrasound was utilized to evaluate blood flow to the ovaries. COMPARISON:  None Available. FINDINGS: Intrauterine gestational sac: None Yolk sac:  Not Visualized. Embryo:  Not Visualized. Cardiac Activity: Not Visualized. Heart Rate: NA bpm Subchorionic hemorrhage:  None visualized. Maternal uterus/adnexae: Right ovary: Normal Left ovary: Normal. Adjacent to the left ovary is an indeterminate, heterogeneous and slightly echogenic structure measuring 2.1 by 2.0 x 1.7 cm. This cannot be confidently separated by ultrasound from the left ovary. Study limited due to enlarged fibroid uterus. Other :Enlarged fibroid uterus. The largest fibroid is in the right myometrium measuring 7.7 x 8.3 x 7.4 cm. Free fluid:  None Pulsed Doppler evaluation of both ovaries demonstrates normal appearing low-resistance arterial and venous waveforms. IMPRESSION: 1. No intrauterine gestational sac, yolk sac, or fetal pole identified. Differential considerations include intrauterine pregnancy too early to be sonographically visualized, missed abortion, or ectopic pregnancy. 2. Heterogeneous, slightly echogenic structure mass within the left adnexa measures  2.1 cm. Not confidently separable from the left ovary. Indeterminate for left ectopic pregnancy. 3. Enlarged fibroid uterus. 4. No signs of ovarian torsion. Electronically Signed: By: Signa Kell M.D. On: 06/06/2023 06:47     PROCEDURES:  Critical Care performed: Yes, see critical care procedure note(s)   CRITICAL CARE Performed by: Baxter Hire Taitum Alms   Total critical care time: 35 minutes  Critical care time was exclusive of separately billable procedures and treating other patients.  Critical care was necessary to treat or prevent imminent or life-threatening deterioration.  Critical care was time spent personally by me on the following activities: development of treatment plan with patient and/or surrogate as well as nursing, discussions with consultants, evaluation of patient's response to treatment, examination of patient, obtaining history from patient or surrogate, ordering and performing treatments and interventions, ordering and review of laboratory studies, ordering and review of radiographic studies, pulse oximetry and re-evaluation of patient's condition.   Marland Kitchen1-3 Lead EKG Interpretation  Performed by: Amoni Morales, Layla Maw, DO Authorized by: Konnor Vondrasek, Layla Maw, DO     Interpretation: normal     ECG rate:  70   ECG rate assessment: normal     Rhythm: sinus rhythm     Ectopy: none     Conduction: normal       IMPRESSION / MDM / ASSESSMENT AND PLAN / ED COURSE  I reviewed the triage vital signs and the nursing notes.    Patient here with left pelvic pain.  Currently being treated for presumed ectopic pregnancy by [facility].     DIFFERENTIAL DIAGNOSIS (includes but not limited to):   Ruptured ectopic, ovarian cyst, torsion, UTI   Patient's presentation is most consistent with acute presentation with potential threat to life or bodily function.   PLAN: Will obtain labs, urine, transvaginal ultrasound with Doppler.  Will give IV fluids, pain and nausea medicine.  Will keep  NPO.  Patient is very frustrated understandably by this process and that she has had previous ultrasounds that have not demonstrated the location of this pregnancy.  [facility] but then subsequently has been transferred under [facility] care.  She states she lives here in [place] but does not  have a local OB/GYN.  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   MEDICATIONS GIVEN IN ED: Medications  sodium chloride 0.9 % bolus 1,000 mL (0 mLs Intravenous Stopped 06/06/23 0450)  morphine (PF) 4 MG/ML injection 4 mg (4 mg Intravenous Given 06/06/23 0419)  ondansetron (ZOFRAN) injection 4 mg (4 mg Intravenous Given 06/06/23 0419)     ED COURSE:  7:00 AM  Pt's labs show that her hCG is rising and is now 4142.  Hemoglobin is also 9.9, was 10.9 on 05/27/2023, 11.2 on 05/24/2023.  She continues to be hemodynamically stable and her pain is well-controlled after 1 dose of morphine.  Transvaginal ultrasound reviewed and interpreted by myself and the radiologist and is concerning for a left ectopic pregnancy.  No IUP visualized.  Discussed this with patient and family at bedside.  Patient would prefer surgical intervention at this point.  She does not have a local OB/GYN and has been receiving care at [facility] and would like to be transferred back to [facility] for definitive management.  Will discuss with the transfer center.  7:09 AM  Discussed with transfer center.  CONSULTS:  7:19 AM  Discussed with OBGYN, Dr. Bonner Puna, at [facility] who agrees to accept patient ED to ED.  Appreciate OBGYN assistance.   OUTSIDE RECORDS REVIEWED: Reviewed recent OB/GYN notes at [facility].       FINAL CLINICAL IMPRESSION(S) / ED DIAGNOSES   Final diagnoses:  Pelvic pain in pregnancy  Ectopic pregnancy of left ovary     Rx / DC Orders   ED Discharge Orders     None        Note:  This document was prepared using Dragon voice recognition software and may include  unintentional dictation errors.   Bobbie Virden, Layla Maw, DO 06/06/23 [phone]

## 2023-06-06 NOTE — ED Notes (Signed)
[facility] unable to transport, [facility] can transport when truck available in 1 - 1 1/2 hrs

## 2023-06-06 NOTE — ED Notes (Signed)
Patient placed onto monitor, including CCM. VSS, call light within reach.

## 2023-06-06 NOTE — ED Notes (Signed)
Called [facility] for transfer per Dr. Elesa Massed, [facility] faxed, Romeo [employer] to [employer]

## 2023-06-06 NOTE — ED Notes (Signed)
[facility] called stated it will be several hours before truck is available. [facility] called for availability

## 2023-06-06 NOTE — ED Notes (Signed)
Patient now returned from [place].

## 2023-08-24 ENCOUNTER — Other Ambulatory Visit: Payer: Self-pay | Admitting: Obstetrics and Gynecology

## 2023-08-24 DIAGNOSIS — D219 Benign neoplasm of connective and other soft tissue, unspecified: Secondary | ICD-10-CM

## 2023-09-07 ENCOUNTER — Ambulatory Visit
Admission: RE | Admit: 2023-09-07 | Discharge: 2023-09-07 | Disposition: A | Discharge status: Home / Self Care | Payer: No Typology Code available for payment source | Source: Ambulatory Visit | Attending: Obstetrics and Gynecology | Admitting: Obstetrics and Gynecology

## 2023-09-07 DIAGNOSIS — D219 Benign neoplasm of connective and other soft tissue, unspecified: Secondary | ICD-10-CM | POA: Diagnosis present

## 2023-09-07 MED ORDER — GADOBUTROL 1 MMOL/ML IV SOLN
10.0000 mL | Freq: Once | INTRAVENOUS | Status: AC | PRN
  Administered 2023-09-07: 10 mL via INTRAVENOUS
